# Patient Record
Sex: Male | Born: 1980 | Race: Black or African American | Hispanic: No | Marital: Married | Smoking: Current every day smoker
Health system: Southern US, Community
[De-identification: ages and names within clinical notes are randomized; demographics above are authoritative.]

## PROBLEM LIST (undated history)

## (undated) DIAGNOSIS — I10 Essential (primary) hypertension: Secondary | ICD-10-CM

## (undated) DIAGNOSIS — W540XXA Bitten by dog, initial encounter: Secondary | ICD-10-CM

## (undated) DIAGNOSIS — W3400XA Accidental discharge from unspecified firearms or gun, initial encounter: Secondary | ICD-10-CM

## (undated) DIAGNOSIS — W503XXA Accidental bite by another person, initial encounter: Secondary | ICD-10-CM

## (undated) DIAGNOSIS — A599 Trichomoniasis, unspecified: Secondary | ICD-10-CM

## (undated) DIAGNOSIS — Y249XXA Unspecified firearm discharge, undetermined intent, initial encounter: Secondary | ICD-10-CM

---

## 2005-03-26 ENCOUNTER — Emergency Department (HOSPITAL_COMMUNITY): Admission: EM | Admit: 2005-03-26 | Discharge: 2005-03-26 | Payer: Self-pay | Admitting: Emergency Medicine

## 2005-08-29 ENCOUNTER — Emergency Department (HOSPITAL_COMMUNITY): Admission: EM | Admit: 2005-08-29 | Discharge: 2005-08-29 | Payer: Self-pay | Admitting: Family Medicine

## 2005-11-04 ENCOUNTER — Emergency Department (HOSPITAL_COMMUNITY): Admission: EM | Admit: 2005-11-04 | Discharge: 2005-11-04 | Payer: Self-pay | Admitting: Emergency Medicine

## 2005-11-06 ENCOUNTER — Emergency Department (HOSPITAL_COMMUNITY): Admission: EM | Admit: 2005-11-06 | Discharge: 2005-11-06 | Payer: Self-pay | Admitting: Emergency Medicine

## 2005-11-13 ENCOUNTER — Emergency Department (HOSPITAL_COMMUNITY): Admission: EM | Admit: 2005-11-13 | Discharge: 2005-11-13 | Payer: Self-pay | Admitting: Emergency Medicine

## 2006-04-13 ENCOUNTER — Emergency Department (HOSPITAL_COMMUNITY): Admission: EM | Admit: 2006-04-13 | Discharge: 2006-04-13 | Payer: Self-pay | Admitting: Emergency Medicine

## 2006-04-15 ENCOUNTER — Emergency Department (HOSPITAL_COMMUNITY): Admission: EM | Admit: 2006-04-15 | Discharge: 2006-04-15 | Payer: Self-pay | Admitting: Emergency Medicine

## 2006-04-24 ENCOUNTER — Emergency Department (HOSPITAL_COMMUNITY): Admission: EM | Admit: 2006-04-24 | Discharge: 2006-04-24 | Payer: Self-pay | Admitting: Emergency Medicine

## 2006-04-24 ENCOUNTER — Emergency Department (HOSPITAL_COMMUNITY): Admission: EM | Admit: 2006-04-24 | Discharge: 2006-04-24 | Payer: Self-pay | Admitting: Family Medicine

## 2007-06-05 ENCOUNTER — Emergency Department (HOSPITAL_COMMUNITY): Admission: EM | Admit: 2007-06-05 | Discharge: 2007-06-05 | Payer: Self-pay | Admitting: Family Medicine

## 2007-06-06 ENCOUNTER — Emergency Department (HOSPITAL_COMMUNITY): Admission: EM | Admit: 2007-06-06 | Discharge: 2007-06-07 | Payer: Self-pay | Admitting: Emergency Medicine

## 2007-08-30 ENCOUNTER — Emergency Department (HOSPITAL_COMMUNITY): Admission: EM | Admit: 2007-08-30 | Discharge: 2007-08-30 | Payer: Self-pay | Admitting: Emergency Medicine

## 2007-08-31 ENCOUNTER — Emergency Department (HOSPITAL_COMMUNITY): Admission: EM | Admit: 2007-08-31 | Discharge: 2007-08-31 | Payer: Self-pay | Admitting: Emergency Medicine

## 2007-10-04 ENCOUNTER — Emergency Department (HOSPITAL_COMMUNITY): Admission: EM | Admit: 2007-10-04 | Discharge: 2007-10-04 | Payer: Self-pay | Admitting: Emergency Medicine

## 2009-03-11 ENCOUNTER — Emergency Department (HOSPITAL_COMMUNITY): Admission: EM | Admit: 2009-03-11 | Discharge: 2009-03-11 | Payer: Self-pay | Admitting: Family Medicine

## 2009-03-14 ENCOUNTER — Emergency Department (HOSPITAL_COMMUNITY): Admission: EM | Admit: 2009-03-14 | Discharge: 2009-03-14 | Payer: Self-pay | Admitting: Emergency Medicine

## 2009-08-16 ENCOUNTER — Emergency Department (HOSPITAL_COMMUNITY): Admission: EM | Admit: 2009-08-16 | Discharge: 2009-08-16 | Payer: Self-pay | Admitting: Emergency Medicine

## 2009-09-24 ENCOUNTER — Emergency Department (HOSPITAL_COMMUNITY): Admission: EM | Admit: 2009-09-24 | Discharge: 2009-09-24 | Payer: Self-pay | Admitting: Family Medicine

## 2010-08-30 LAB — GC/CHLAMYDIA PROBE AMP, GENITAL
Chlamydia, DNA Probe: NEGATIVE
GC Probe Amp, Genital: NEGATIVE

## 2011-02-14 LAB — POCT URINALYSIS DIP (DEVICE)
Bilirubin Urine: NEGATIVE
Glucose, UA: NEGATIVE
Ketones, ur: NEGATIVE
Nitrite: NEGATIVE
Operator id: 239701
Protein, ur: NEGATIVE
Specific Gravity, Urine: 1.025
Urobilinogen, UA: 1
pH: 6

## 2011-02-14 LAB — GC/CHLAMYDIA PROBE AMP, GENITAL
Chlamydia, DNA Probe: NEGATIVE
GC Probe Amp, Genital: NEGATIVE

## 2011-11-12 ENCOUNTER — Telehealth: Payer: Self-pay | Admitting: Pulmonary Disease

## 2011-11-12 NOTE — Telephone Encounter (Signed)
Documentation opened in error.

## 2012-02-03 ENCOUNTER — Encounter (HOSPITAL_COMMUNITY): Payer: Self-pay | Admitting: *Deleted

## 2012-02-03 ENCOUNTER — Emergency Department (INDEPENDENT_AMBULATORY_CARE_PROVIDER_SITE_OTHER)
Admission: EM | Admit: 2012-02-03 | Discharge: 2012-02-03 | Disposition: A | Discharge status: Home / Self Care | Payer: Self-pay | Source: Home / Self Care | Attending: Emergency Medicine | Admitting: Emergency Medicine

## 2012-02-03 DIAGNOSIS — N342 Other urethritis: Secondary | ICD-10-CM

## 2012-02-03 HISTORY — DX: Bitten by dog, initial encounter: W54.0XXA

## 2012-02-03 HISTORY — DX: Accidental bite by another person, initial encounter: W50.3XXA

## 2012-02-03 HISTORY — DX: Trichomoniasis, unspecified: A59.9

## 2012-02-03 HISTORY — DX: Unspecified firearm discharge, undetermined intent, initial encounter: Y24.9XXA

## 2012-02-03 HISTORY — DX: Accidental discharge from unspecified firearms or gun, initial encounter: W34.00XA

## 2012-02-03 LAB — WET PREP, GENITAL
Clue Cells Wet Prep HPF POC: NONE SEEN
Yeast Wet Prep HPF POC: NONE SEEN

## 2012-02-03 MED ORDER — LIDOCAINE HCL (PF) 1 % IJ SOLN
INTRAMUSCULAR | Status: AC
  Filled 2012-02-03: qty 5

## 2012-02-03 MED ORDER — AZITHROMYCIN 250 MG PO TABS
1000.0000 mg | ORAL_TABLET | Freq: Once | ORAL | Status: AC
  Administered 2012-02-03: 1000 mg via ORAL

## 2012-02-03 MED ORDER — AZITHROMYCIN 250 MG PO TABS
ORAL_TABLET | ORAL | Status: AC
  Filled 2012-02-03: qty 4

## 2012-02-03 MED ORDER — CEFTRIAXONE SODIUM 250 MG IJ SOLR
250.0000 mg | Freq: Once | INTRAMUSCULAR | Status: AC
  Administered 2012-02-03: 250 mg via INTRAMUSCULAR

## 2012-02-03 MED ORDER — CEFTRIAXONE SODIUM 250 MG IJ SOLR
INTRAMUSCULAR | Status: AC
  Filled 2012-02-03: qty 250

## 2012-02-03 MED ORDER — METRONIDAZOLE 500 MG PO TABS: 500.0000 mg | ORAL_TABLET | Freq: Two times a day (BID) | ORAL | Status: AC
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
## 2012-02-03 NOTE — ED Notes (Signed)
Pt reports yellow penile discharge and burning upon urination that started 3 days ago

## 2012-02-04 LAB — GC/CHLAMYDIA PROBE AMP, GENITAL
Chlamydia, DNA Probe: NEGATIVE
GC Probe Amp, Genital: NEGATIVE

## 2012-02-11 ENCOUNTER — Telehealth (HOSPITAL_COMMUNITY): Payer: Self-pay | Admitting: *Deleted

## 2012-02-11 NOTE — ED Notes (Signed)
Pt. called and asked for his lab results. I told him I would call back shortly. I called pt. back. Verifiedx2and given results. ( GC/Chlamydia neg., Wet prep: few WBC's). Pt. told that all was neg. Pt. is concerned because he had a yellowish d/c but does not know what caused it. I asked if it was gone?   He said it cleared up after the medication, but then passed a drop of blood from his penis x 1 after a BM.  I  Asked if he had any urinary symptoms like burning, frequency or urgency of urination.  He said no. I told pt. if it was just 1 time not to worry but if it returns, to come back and be rechecked.   Pt. voiced understanding. Vassie Moselle 02/11/2012

## 2013-08-17 ENCOUNTER — Other Ambulatory Visit (HOSPITAL_COMMUNITY)
Admission: RE | Admit: 2013-08-17 | Discharge: 2013-08-17 | Disposition: A | Discharge status: Home / Self Care | Payer: Self-pay | Source: Ambulatory Visit | Attending: Family Medicine | Admitting: Family Medicine

## 2013-08-17 ENCOUNTER — Encounter (HOSPITAL_COMMUNITY): Payer: Self-pay | Admitting: Emergency Medicine

## 2013-08-17 ENCOUNTER — Emergency Department (INDEPENDENT_AMBULATORY_CARE_PROVIDER_SITE_OTHER)
Admission: EM | Admit: 2013-08-17 | Discharge: 2013-08-17 | Disposition: A | Discharge status: Home / Self Care | Payer: Self-pay | Source: Home / Self Care | Attending: Family Medicine | Admitting: Family Medicine

## 2013-08-17 DIAGNOSIS — N342 Other urethritis: Secondary | ICD-10-CM

## 2013-08-17 DIAGNOSIS — Z113 Encounter for screening for infections with a predominantly sexual mode of transmission: Secondary | ICD-10-CM | POA: Insufficient documentation

## 2013-08-17 MED ORDER — CEFTRIAXONE SODIUM 1 G IJ SOLR: 1.0000 g | Freq: Once | INTRAMUSCULAR | Status: DC

## 2013-08-17 MED ORDER — CEFTRIAXONE SODIUM 250 MG IJ SOLR
INTRAMUSCULAR | Status: AC
  Filled 2013-08-17: qty 250

## 2013-08-17 MED ORDER — AZITHROMYCIN 250 MG PO TABS
ORAL_TABLET | ORAL | Status: AC
  Filled 2013-08-17: qty 4

## 2013-08-17 MED ORDER — CEFTRIAXONE SODIUM 250 MG IJ SOLR
250.0000 mg | Freq: Once | INTRAMUSCULAR | Status: AC
  Administered 2013-08-17: 250 mg via INTRAMUSCULAR

## 2013-08-17 MED ORDER — AZITHROMYCIN 250 MG PO TABS
1000.0000 mg | ORAL_TABLET | Freq: Once | ORAL | Status: AC
  Administered 2013-08-17: 1000 mg via ORAL

## 2013-08-17 MED ORDER — METRONIDAZOLE 500 MG PO TABS: 500.0000 mg | ORAL_TABLET | Freq: Two times a day (BID) | ORAL | Status: DC

## 2013-08-17 NOTE — ED Notes (Signed)
Pt     Reports  Symptoms    Of        Penile  Discharge         With  A  Slight  Burning    When     He  Urinates
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
## 2013-08-17 NOTE — Discharge Instructions (Signed)
We will call with positive test results and treat as indicated  °

## 2013-08-18 LAB — CYTOLOGY, (ORAL, ANAL, URETHRAL) ANCILLARY ONLY
Chlamydia: NEGATIVE
Neisseria Gonorrhea: NEGATIVE

## 2013-08-18 LAB — URINE CYTOLOGY ANCILLARY ONLY: TRICH (WINDOWPATH): POSITIVE — AB

## 2013-08-19 NOTE — ED Notes (Signed)
GC/Chlamydia neg., Trich pos. Pt. adequately treated with Flagyl. Needs notified. Vassie MoselleYork, Nelissa Bolduc M 08/19/2013

## 2013-08-20 ENCOUNTER — Telehealth (HOSPITAL_COMMUNITY): Payer: Self-pay | Admitting: *Deleted

## 2013-08-20 NOTE — ED Notes (Addendum)
GC/Chlamydia neg., Trich pos. Pt. adequately treated with Flagyl. I called pt. and left a message to call.  Call 1. Alexander Stone, Alexander Stone M 08/20/2013 The person that answered the phone said it is wrong number. I verified that I dialed correctly. I called home number and left a message to call.  Call 3. 08/23/2013 4/6 Unable to reach pt. by phone. Letter sent. 09/15/2013

## 2013-12-15 ENCOUNTER — Other Ambulatory Visit (HOSPITAL_COMMUNITY)
Admission: RE | Admit: 2013-12-15 | Discharge: 2013-12-15 | Disposition: A | Discharge status: Home / Self Care | Payer: Self-pay | Source: Ambulatory Visit | Attending: Family Medicine | Admitting: Family Medicine

## 2013-12-15 ENCOUNTER — Emergency Department (HOSPITAL_COMMUNITY)
Admission: EM | Admit: 2013-12-15 | Discharge: 2013-12-15 | Disposition: A | Discharge status: Home / Self Care | Payer: Self-pay | Source: Home / Self Care | Attending: Family Medicine | Admitting: Family Medicine

## 2013-12-15 ENCOUNTER — Encounter (HOSPITAL_COMMUNITY): Payer: Self-pay | Admitting: Emergency Medicine

## 2013-12-15 DIAGNOSIS — Z202 Contact with and (suspected) exposure to infections with a predominantly sexual mode of transmission: Secondary | ICD-10-CM

## 2013-12-15 DIAGNOSIS — Z113 Encounter for screening for infections with a predominantly sexual mode of transmission: Secondary | ICD-10-CM | POA: Insufficient documentation

## 2013-12-15 DIAGNOSIS — N342 Other urethritis: Secondary | ICD-10-CM

## 2013-12-15 MED ORDER — METRONIDAZOLE 500 MG PO TABS: 1000.0000 mg | ORAL_TABLET | Freq: Two times a day (BID) | ORAL | Status: DC

## 2013-12-15 NOTE — Discharge Instructions (Signed)
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
## 2013-12-15 NOTE — ED Notes (Signed)
Call patient at : [phone] for lab issues

## 2013-12-15 NOTE — ED Notes (Signed)
Previous partener informed him >1 month ago that she had trichomoniasis , but he had no symptoms.  Now has developed "internal irritation of his penis" denies visible changes in skin

## 2013-12-17 NOTE — ED Provider Notes (Signed)
Medical screening examination/treatment/procedure(s) were performed by a resident physician or non-physician practitioner and as the supervising physician I was immediately available for consultation/collaboration.  Clementeen GrahamEvan Kazuko Clemence, MD    Rodolph BongEvan S Guled Gahan, MD 12/17/13 [phone]

## 2014-01-26 ENCOUNTER — Encounter (HOSPITAL_COMMUNITY): Payer: Self-pay | Admitting: Emergency Medicine

## 2014-01-26 ENCOUNTER — Emergency Department (INDEPENDENT_AMBULATORY_CARE_PROVIDER_SITE_OTHER)
Admission: EM | Admit: 2014-01-26 | Discharge: 2014-01-26 | Disposition: A | Discharge status: Home / Self Care | Payer: Self-pay | Source: Home / Self Care | Attending: Family Medicine | Admitting: Family Medicine

## 2014-01-26 DIAGNOSIS — A599 Trichomoniasis, unspecified: Secondary | ICD-10-CM

## 2014-01-26 MED ORDER — METRONIDAZOLE 500 MG PO TABS: 1000.0000 mg | ORAL_TABLET | Freq: Two times a day (BID) | ORAL | Status: DC

## 2014-01-26 NOTE — ED Notes (Signed)
States he was here in July because his girlfriend had Trich.  He was given Metronidazole.  D/C stopped and started back 1 week ago.  Had sexual contact on Tues with a condom.
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
## 2014-01-26 NOTE — Discharge Instructions (Signed)
You likely still have a trichomonal infection Please take the metro as prescribed for the full 7 days Please avoid alcohol during this time   Trichomoniasis Trichomoniasis is an infection caused by an organism called Trichomonas. The infection can affect both women and men. In women, the outer male genitalia and the vagina are affected. In men, the penis is mainly affected, but the prostate and other reproductive organs can also be involved. Trichomoniasis is a sexually transmitted infection (STI) and is most often passed to another person through sexual contact.  RISK FACTORS  Having unprotected sexual intercourse.  Having sexual intercourse with an infected partner. SIGNS AND SYMPTOMS  Symptoms of trichomoniasis in women include:  Abnormal gray-green frothy vaginal discharge.  Itching and irritation of the vagina.  Itching and irritation of the area outside the vagina. Symptoms of trichomoniasis in men include:   Penile discharge with or without pain.  Pain during urination. This results from inflammation of the urethra. DIAGNOSIS  Trichomoniasis may be found during a Pap test or physical exam. Your health care provider may use one of the following methods to help diagnose this infection:  Examining vaginal discharge under a microscope. For men, urethral discharge would be examined.  Testing the pH of the vagina with a test tape.  Using a vaginal swab test that checks for the Trichomonas organism. A test is available that provides results within a few minutes.  Doing a culture test for the organism. This is not usually needed. TREATMENT   You may be given medicine to fight the infection. Women should inform their health care provider if they could be or are pregnant. Some medicines used to treat the infection should not be taken during pregnancy.  Your health care provider may recommend over-the-counter medicines or creams to decrease itching or irritation.  Your sexual  partner will need to be treated if infected. HOME CARE INSTRUCTIONS   Take medicines only as directed by your health care provider.  Take over-the-counter medicine for itching or irritation as directed by your health care provider.  Do not have sexual intercourse while you have the infection.  Women should not douche or wear tampons while they have the infection.  Discuss your infection with your partner. Your partner may have gotten the infection from you, or you may have gotten it from your partner.  Have your sex partner get examined and treated if necessary.  Practice safe, informed, and protected sex.  See your health care provider for other STI testing. SEEK MEDICAL CARE IF:   You still have symptoms after you finish your medicine.  You develop abdominal pain.  You have pain when you urinate.  You have bleeding after sexual intercourse.  You develop a rash.  Your medicine makes you sick or makes you throw up (vomit). MAKE SURE YOU:  Understand these instructions.  Will watch your condition.  Will get help right away if you are not doing well or get worse. Document Released: 11/06/2000 Document Revised: 09/27/2013 Document Reviewed: 02/22/2013 [facility] Patient Information 2015 [place], [place]. This information is not intended to replace advice given to you by your health care provider. Make sure you discuss any questions you have with your health care provider.

## 2014-09-09 ENCOUNTER — Emergency Department (HOSPITAL_COMMUNITY): Payer: Self-pay

## 2014-09-09 ENCOUNTER — Encounter (HOSPITAL_COMMUNITY): Payer: Self-pay | Admitting: Emergency Medicine

## 2014-09-09 ENCOUNTER — Emergency Department (HOSPITAL_COMMUNITY)
Admission: EM | Admit: 2014-09-09 | Discharge: 2014-09-09 | Disposition: A | Discharge status: Home / Self Care | Payer: Self-pay | Attending: Emergency Medicine | Admitting: Emergency Medicine

## 2014-09-09 DIAGNOSIS — Z8619 Personal history of other infectious and parasitic diseases: Secondary | ICD-10-CM | POA: Insufficient documentation

## 2014-09-09 DIAGNOSIS — Y92410 Unspecified street and highway as the place of occurrence of the external cause: Secondary | ICD-10-CM | POA: Insufficient documentation

## 2014-09-09 DIAGNOSIS — Z880 Allergy status to penicillin: Secondary | ICD-10-CM | POA: Insufficient documentation

## 2014-09-09 DIAGNOSIS — S3992XA Unspecified injury of lower back, initial encounter: Secondary | ICD-10-CM | POA: Insufficient documentation

## 2014-09-09 DIAGNOSIS — Z720 Tobacco use: Secondary | ICD-10-CM | POA: Insufficient documentation

## 2014-09-09 DIAGNOSIS — Y998 Other external cause status: Secondary | ICD-10-CM | POA: Insufficient documentation

## 2014-09-09 DIAGNOSIS — Z792 Long term (current) use of antibiotics: Secondary | ICD-10-CM | POA: Insufficient documentation

## 2014-09-09 DIAGNOSIS — Y9389 Activity, other specified: Secondary | ICD-10-CM | POA: Insufficient documentation

## 2014-09-09 DIAGNOSIS — S0990XA Unspecified injury of head, initial encounter: Secondary | ICD-10-CM | POA: Insufficient documentation

## 2014-09-09 DIAGNOSIS — S20219A Contusion of unspecified front wall of thorax, initial encounter: Secondary | ICD-10-CM | POA: Insufficient documentation

## 2014-09-09 DIAGNOSIS — S161XXA Strain of muscle, fascia and tendon at neck level, initial encounter: Secondary | ICD-10-CM | POA: Insufficient documentation

## 2014-09-09 MED ORDER — IBUPROFEN 200 MG PO TABS
600.0000 mg | ORAL_TABLET | Freq: Once | ORAL | Status: AC
  Administered 2014-09-09: 600 mg via ORAL
  Filled 2014-09-09: qty 3

## 2014-09-09 MED ORDER — CYCLOBENZAPRINE HCL 5 MG PO TABS: 5.0000 mg | ORAL_TABLET | Freq: Three times a day (TID) | ORAL | Status: DC | PRN

## 2014-09-09 MED ORDER — IBUPROFEN 600 MG PO TABS: 600.0000 mg | ORAL_TABLET | Freq: Three times a day (TID) | ORAL | Status: DC
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
## 2014-10-10 ENCOUNTER — Encounter (HOSPITAL_COMMUNITY): Payer: Self-pay

## 2014-10-10 ENCOUNTER — Other Ambulatory Visit (HOSPITAL_COMMUNITY)
Admission: RE | Admit: 2014-10-10 | Discharge: 2014-10-10 | Disposition: A | Discharge status: Home / Self Care | Payer: Self-pay | Source: Ambulatory Visit | Attending: Family Medicine | Admitting: Family Medicine

## 2014-10-10 ENCOUNTER — Emergency Department (INDEPENDENT_AMBULATORY_CARE_PROVIDER_SITE_OTHER)
Admission: EM | Admit: 2014-10-10 | Discharge: 2014-10-10 | Disposition: A | Discharge status: Home / Self Care | Payer: Self-pay | Source: Home / Self Care | Attending: Family Medicine | Admitting: Family Medicine

## 2014-10-10 DIAGNOSIS — Z113 Encounter for screening for infections with a predominantly sexual mode of transmission: Secondary | ICD-10-CM | POA: Insufficient documentation

## 2014-10-10 DIAGNOSIS — N342 Other urethritis: Secondary | ICD-10-CM

## 2014-10-10 MED ORDER — LIDOCAINE HCL (PF) 1 % IJ SOLN
INTRAMUSCULAR | Status: AC
  Filled 2014-10-10: qty 5

## 2014-10-10 MED ORDER — AZITHROMYCIN 250 MG PO TABS
1000.0000 mg | ORAL_TABLET | Freq: Once | ORAL | Status: AC
  Administered 2014-10-10: 1000 mg via ORAL

## 2014-10-10 MED ORDER — CEFTRIAXONE SODIUM 250 MG IJ SOLR
250.0000 mg | Freq: Once | INTRAMUSCULAR | Status: AC
  Administered 2014-10-10: 250 mg via INTRAMUSCULAR

## 2014-10-10 MED ORDER — AZITHROMYCIN 250 MG PO TABS
ORAL_TABLET | ORAL | Status: AC
  Filled 2014-10-10: qty 4

## 2014-10-10 MED ORDER — CEFTRIAXONE SODIUM 250 MG IJ SOLR
INTRAMUSCULAR | Status: AC
  Filled 2014-10-10: qty 250
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
## 2014-10-10 NOTE — ED Notes (Signed)
Was reportedly told by a partner to be checked

## 2014-10-10 NOTE — Discharge Instructions (Signed)
Urethritis °Urethritis is an inflammation of the tube through which urine exits your bladder (urethra).  °CAUSES °Urethritis is often caused by an infection in your urethra. The infection can be viral, like herpes. The infection can also be bacterial, like gonorrhea. °RISK FACTORS °Risk factors of urethritis include: °· Having sex without using a condom. °· Having multiple sexual partners. °· Having poor hygiene. °SIGNS AND SYMPTOMS °Symptoms of urethritis are less noticeable in women than in men. These symptoms include: °· Burning feeling when you urinate (dysuria). °· Discharge from your urethra. °· Blood in your urine (hematuria). °· Urinating more than usual. °DIAGNOSIS  °To confirm a diagnosis of urethritis, your health care provider will do the following: °· Ask about your sexual history. °· Perform a physical exam. °· Have you provide a sample of your urine for lab testing. °· Use a cotton swab to gently collect a sample from your urethra for lab testing. °TREATMENT  °It is important to treat urethritis. Depending on the cause, untreated urethritis may lead to serious genital infections and possibly infertility. Urethritis caused by a bacterial infection is treated with antibiotic medicine. All sexual partners must be treated.  °HOME CARE INSTRUCTIONS °· Do not have sex until the test results are known and treatment is completed, even if your symptoms go away before you finish treatment. °· If you were prescribed an antibiotic, finish it all even if you start to feel better. °SEEK MEDICAL CARE IF:  °· Your symptoms are not improved in 3 days. °· Your symptoms are getting worse. °· You develop abdominal pain or pelvic pain (in women). °· You develop joint pain. °· You have a fever. °SEEK IMMEDIATE MEDICAL CARE IF:  °· You have severe pain in the belly, back, or side. °· You have repeated vomiting. °MAKE SURE YOU: °· Understand these instructions. °· Will watch your condition. °· Will get help right away if you  are not doing well or get worse. °Document Released: 11/06/2000 Document Revised: 09/27/2013 Document Reviewed: 01/11/2013 °ExitCare® Patient Information ©2015 [employer]. This information is not intended to replace advice given to you by your health care provider. Make sure you discuss any questions you have with your health care provider. ° °

## 2014-10-11 LAB — RPR: RPR: NONREACTIVE

## 2014-10-11 LAB — URINE CYTOLOGY ANCILLARY ONLY
CHLAMYDIA, DNA PROBE: POSITIVE — AB
NEISSERIA GONORRHEA: NEGATIVE
TRICH (WINDOWPATH): NEGATIVE

## 2014-10-11 LAB — HIV ANTIBODY (ROUTINE TESTING W REFLEX): HIV Screen 4th Generation wRfx: NONREACTIVE

## 2014-10-12 ENCOUNTER — Telehealth (HOSPITAL_COMMUNITY): Payer: Self-pay | Admitting: *Deleted

## 2014-10-12 NOTE — ED Notes (Signed)
GC/Trich neg., Chlamydia pos., HIV/RPR non-reactive.  I called pt.  Pt. verified x 2.  Pt. said someone already notified him today. He asked if he needed any other treatment. I told him he was adequately treated with Zithromax.  Pt. instructed to notify his partner, no sex for 1 week and to practice safe sex. Pt. told he [facility], by appointment. Pt. voiced understanding. DHHS [facility]. Vassie MoselleYork, Alexander Stone 10/12/2014

## 2014-11-07 ENCOUNTER — Encounter: Payer: Self-pay | Admitting: *Deleted

## 2014-11-07 ENCOUNTER — Emergency Department (INDEPENDENT_AMBULATORY_CARE_PROVIDER_SITE_OTHER)
Admission: EM | Admit: 2014-11-07 | Discharge: 2014-11-07 | Disposition: A | Discharge status: Home / Self Care | Payer: Self-pay | Source: Home / Self Care | Attending: Family Medicine | Admitting: Family Medicine

## 2014-11-07 ENCOUNTER — Encounter (HOSPITAL_COMMUNITY): Payer: Self-pay | Admitting: Emergency Medicine

## 2014-11-07 ENCOUNTER — Other Ambulatory Visit (HOSPITAL_COMMUNITY)
Admission: RE | Admit: 2014-11-07 | Discharge: 2014-11-07 | Disposition: A | Discharge status: Home / Self Care | Payer: Self-pay | Source: Ambulatory Visit | Attending: Family Medicine | Admitting: Family Medicine

## 2014-11-07 DIAGNOSIS — A749 Chlamydial infection, unspecified: Secondary | ICD-10-CM

## 2014-11-07 DIAGNOSIS — Z113 Encounter for screening for infections with a predominantly sexual mode of transmission: Secondary | ICD-10-CM | POA: Insufficient documentation

## 2014-11-07 MED ORDER — AZITHROMYCIN 250 MG PO TABS
ORAL_TABLET | ORAL | Status: AC
  Filled 2014-11-07: qty 4

## 2014-11-07 MED ORDER — AZITHROMYCIN 250 MG PO TABS
1000.0000 mg | ORAL_TABLET | Freq: Once | ORAL | Status: AC
  Administered 2014-11-07: 1000 mg via ORAL

## 2014-11-07 NOTE — ED Notes (Signed)
Call back number verified.  

## 2014-11-07 NOTE — ED Notes (Signed)
Pt is here to get treated for Chlamydia Seen here on 6/13 Alert, no signs of acute distress.

## 2014-11-07 NOTE — Discharge Instructions (Signed)
Thank you for coming in today.   Chlamydia Chlamydia is an infection. It is spread through sexual contact. Chlamydia can be in different areas of the body. These areas include the urethra, throat, or rectum. It is important to treat chlamydia as soon as possible. It can damage other organs.  CAUSES  Chlamydia is caused by bacteria. It is a sexually transmitted disease. This means that it is passed from an infected partner during intimate contact. This contact could be with the genitals, mouth, or rectal area.  SIGNS AND SYMPTOMS  There may not be any symptoms. This is often the case early in the infection. If there are symptoms, they are usually mild and may only be noticeable in the morning. Symptoms you may notice include:   Burning with urination.  Pain or swelling in the testicles.  Watery mucus-like discharge from the penis.  Long-standing (chronic) pelvic pain after frequent infections.  Pain, swelling, or itching around the anus.  A sore throat.  Itching, burning, or redness in the eyes, or discharge from the eyes. DIAGNOSIS  To diagnose this infection, your health care provider will do a pelvic exam. A sample of urine or a swab from the rectum may be taken for testing.  TREATMENT  Chlamydia is treated with antibiotic medicines.  HOME CARE INSTRUCTIONS  Take your antibiotic medicine as directed by your health care provider. Finish the antibiotic even if you start to feel better. Incomplete treatment will put you at risk for not being able to have children (sterility).   Take medicines only as directed by your health care provider.   Rest.   Inform any sexual partners about your infection. Even if they are symptom free or have a negative culture or evaluation, they should be treated for the condition.   Do not have sex (intercourse) until treatment is completed and your health care provider says it is okay.   Keep all follow-up visits as directed by your health care  provider.   Not all test results are available during your visit. If your test results are not back during the visit, make an appointment with your health care provider to find out the results. Do not assume everything is normal if you have not heard from your health care provider or the medical facility. It is your responsibility to get your test results. SEEK MEDICAL CARE IF:  You develop new joint pain.  You have a fever. SEEK IMMEDIATE MEDICAL CARE IF:   Your pain increases.   You have abnormal discharge.   You have pain during intercourse. MAKE SURE YOU:   Understand these instructions.  Will watch your condition.  Will get help right away if you are not doing well or get worse. Document Released: 05/13/2005 Document Revised: 09/27/2013 Document Reviewed: 11/19/2012 [facility] Patient Information 2015 [place], [place]. This information is not intended to replace advice given to you by your health care provider. Make sure you discuss any questions you have with your health care provider.
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
## 2014-11-08 LAB — URINE CYTOLOGY ANCILLARY ONLY
CHLAMYDIA, DNA PROBE: NEGATIVE
NEISSERIA GONORRHEA: NEGATIVE
Trichomonas: NEGATIVE

## 2014-11-08 NOTE — ED Notes (Signed)
Final reports STD screenings negative

## 2015-02-20 ENCOUNTER — Emergency Department (INDEPENDENT_AMBULATORY_CARE_PROVIDER_SITE_OTHER)
Admission: EM | Admit: 2015-02-20 | Discharge: 2015-02-20 | Disposition: A | Discharge status: Home / Self Care | Payer: Self-pay | Source: Home / Self Care | Attending: Family Medicine | Admitting: Family Medicine

## 2015-02-20 ENCOUNTER — Encounter (HOSPITAL_COMMUNITY): Payer: Self-pay | Admitting: Emergency Medicine

## 2015-02-20 ENCOUNTER — Other Ambulatory Visit (HOSPITAL_COMMUNITY)
Admission: RE | Admit: 2015-02-20 | Discharge: 2015-02-20 | Disposition: A | Discharge status: Home / Self Care | Payer: Self-pay | Source: Ambulatory Visit | Attending: Family Medicine | Admitting: Family Medicine

## 2015-02-20 DIAGNOSIS — Z202 Contact with and (suspected) exposure to infections with a predominantly sexual mode of transmission: Secondary | ICD-10-CM

## 2015-02-20 DIAGNOSIS — Z113 Encounter for screening for infections with a predominantly sexual mode of transmission: Secondary | ICD-10-CM | POA: Insufficient documentation

## 2015-02-20 DIAGNOSIS — R369 Urethral discharge, unspecified: Secondary | ICD-10-CM

## 2015-02-20 DIAGNOSIS — Z7251 High risk heterosexual behavior: Secondary | ICD-10-CM

## 2015-02-20 LAB — POCT URINALYSIS DIP (DEVICE)
Bilirubin Urine: NEGATIVE
GLUCOSE, UA: NEGATIVE mg/dL
Hgb urine dipstick: NEGATIVE
Ketones, ur: NEGATIVE mg/dL
LEUKOCYTES UA: NEGATIVE
NITRITE: NEGATIVE
Protein, ur: NEGATIVE mg/dL
UROBILINOGEN UA: 0.2 mg/dL (ref 0.0–1.0)
pH: 6 (ref 5.0–8.0)

## 2015-02-20 MED ORDER — AZITHROMYCIN 250 MG PO TABS
1000.0000 mg | ORAL_TABLET | Freq: Once | ORAL | Status: AC
  Administered 2015-02-20: 1000 mg via ORAL

## 2015-02-20 MED ORDER — CEFTRIAXONE SODIUM 250 MG IJ SOLR
INTRAMUSCULAR | Status: AC
  Filled 2015-02-20: qty 250

## 2015-02-20 MED ORDER — METRONIDAZOLE 500 MG PO TABS: 500.0000 mg | ORAL_TABLET | Freq: Two times a day (BID) | ORAL | Status: DC

## 2015-02-20 MED ORDER — LIDOCAINE HCL (PF) 1 % IJ SOLN
INTRAMUSCULAR | Status: AC
  Filled 2015-02-20: qty 5

## 2015-02-20 MED ORDER — CEFTRIAXONE SODIUM 250 MG IJ SOLR
250.0000 mg | Freq: Once | INTRAMUSCULAR | Status: AC
  Administered 2015-02-20: 250 mg via INTRAMUSCULAR

## 2015-02-20 MED ORDER — AZITHROMYCIN 250 MG PO TABS
ORAL_TABLET | ORAL | Status: AC
  Filled 2015-02-20: qty 4

## 2015-02-20 NOTE — ED Notes (Signed)
The patient reported to the [facility] with a complaint of an exposure to an STD. The patient stated that he has had a discharge as well as dysuria.
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
## 2015-02-20 NOTE — Discharge Instructions (Signed)
Sexually Transmitted Disease °A sexually transmitted disease (STD) is a disease or infection that may be passed (transmitted) from person to person, usually during sexual activity. This may happen by way of saliva, semen, blood, vaginal mucus, or urine. Common STDs include:  °· Gonorrhea.   °· Chlamydia.   °· Syphilis.   °· HIV and AIDS.   °· Genital herpes.   °· Hepatitis B and C.   °· Trichomonas.   °· Human papillomavirus (HPV).   °· Pubic lice.   °· Scabies. °· Mites. °· Bacterial vaginosis. °WHAT ARE CAUSES OF STDs? °An STD may be caused by bacteria, a virus, or parasites. STDs are often transmitted during sexual activity if one person is infected. However, they may also be transmitted through nonsexual means. STDs may be transmitted after:  °· Sexual intercourse with an infected person.   °· Sharing sex toys with an infected person.   °· Sharing needles with an infected person or using unclean piercing or tattoo needles. °· Having intimate contact with the genitals, mouth, or rectal areas of an infected person.   °· Exposure to infected fluids during birth. °WHAT ARE THE SIGNS AND SYMPTOMS OF STDs? °Different STDs have different symptoms. Some people may not have any symptoms. If symptoms are present, they may include:  °· Painful or bloody urination.   °· Pain in the pelvis, abdomen, vagina, anus, throat, or eyes.   °· A skin rash, itching, or irritation. °· Growths, ulcerations, blisters, or sores in the genital and anal areas. °· Abnormal vaginal discharge with or without bad odor.   °· Penile discharge in men.   °· Fever.   °· Pain or bleeding during sexual intercourse.   °· Swollen glands in the groin area.   °· Yellow skin and eyes (jaundice). This is seen with hepatitis.   °· Swollen testicles. °· Infertility. °· Sores and blisters in the mouth. °HOW ARE STDs DIAGNOSED? °To make a diagnosis, your health care provider may:  °· Take a medical history.   °· Perform a physical exam.   °· Take a sample of  any discharge to examine. °· Swab the throat, cervix, opening to the penis, rectum, or vagina for testing. °· Test a sample of your first morning urine.   °· Perform blood tests.   °· Perform a Pap test, if this applies.   °· Perform a colposcopy.   °· Perform a laparoscopy.   °HOW ARE STDs TREATED? ° Treatment depends on the STD. Some STDs may be treated but not cured.  °· Chlamydia, gonorrhea, trichomonas, and syphilis can be cured with antibiotic medicine.   °· Genital herpes, hepatitis, and HIV can be treated, but not cured, with prescribed medicines. The medicines lessen symptoms.   °· Genital warts from HPV can be treated with medicine or by freezing, burning (electrocautery), or surgery. Warts may come back.   °· HPV cannot be cured with medicine or surgery. However, abnormal areas may be removed from the cervix, vagina, or vulva.   °· If your diagnosis is confirmed, your recent sexual partners need treatment. This is true even if they are symptom-free or have a negative culture or evaluation. They should not have sex until their health care providers say it is okay. °HOW CAN I REDUCE MY RISK OF GETTING AN STD? °Take these steps to reduce your risk of getting an STD: °· Use latex condoms, dental dams, and water-soluble lubricants during sexual activity. Do not use petroleum jelly or oils. °· Avoid having multiple sex partners. °· Do not have sex with someone who has other sex partners. °· Do not have sex with anyone you do not know or who is at   high risk for an STD. °· Avoid risky sex practices that can break your skin. °· Do not have sex if you have open sores on your mouth or skin. °· Avoid drinking too much alcohol or taking illegal drugs. Alcohol and drugs can affect your judgment and put you in a vulnerable position. °· Avoid engaging in oral and anal sex acts. °· Get vaccinated for HPV and hepatitis. If you have not received these vaccines in the past, talk to your health care provider about whether one  or both might be right for you.   °· If you are at risk of being infected with HIV, it is recommended that you take a prescription medicine daily to prevent HIV infection. This is called pre-exposure prophylaxis (PrEP). You are considered at risk if: °¨ You are a man who has sex with other men (MSM). °¨ You are a heterosexual man or woman and are sexually active with more than one partner. °¨ You take drugs by injection. °¨ You are sexually active with a partner who has HIV. °· Talk with your health care provider about whether you are at high risk of being infected with HIV. If you choose to begin PrEP, you should first be tested for HIV. You should then be tested every 3 months for as long as you are taking PrEP.   °WHAT SHOULD I DO IF I THINK I HAVE AN STD? °· See your health care provider.   °· Tell your sexual partner(s). They should be tested and treated for any STDs. °· Do not have sex until your health care provider says it is okay.  °WHEN SHOULD I GET IMMEDIATE MEDICAL CARE? °Contact your health care provider right away if:  °· You have severe abdominal pain. °· You are a man and notice swelling or pain in your testicles. °· You are a woman and notice swelling or pain in your vagina. °Document Released: 08/03/2002 Document Revised: 05/18/2013 Document Reviewed: 12/01/2012 °ExitCare® Patient Information ©2015 [employer]. This information is not intended to replace advice given to you by your health care provider. Make sure you discuss any questions you have with your health care provider. ° °Safe Sex °Safe sex is about reducing the risk of giving or getting a sexually transmitted disease (STD). STDs are spread through sexual contact involving the genitals, mouth, or rectum. Some STDs can be cured and others cannot. Safe sex can also prevent unintended pregnancies.  °WHAT ARE SOME SAFE SEX PRACTICES? °· Limit your sexual activity to only one partner who is having sex with only you. °· Talk to your partner  about his or her past partners, past STDs, and drug use. °· Use a condom every time you have sexual intercourse. This includes vaginal, oral, and anal sexual activity. Both females and males should wear condoms during oral sex. Only use latex or polyurethane condoms and water-based lubricants. Using petroleum-based lubricants or oils to lubricate a condom will weaken the condom and increase the chance that it will break. The condom should be in place from the beginning to the end of sexual activity. Wearing a condom reduces, but does not completely eliminate, your risk of getting or giving an STD. STDs can be spread by contact with infected body fluids and skin. °· Get vaccinated for hepatitis B and HPV. °· Avoid alcohol and recreational drugs, which can affect your judgment. You may forget to use a condom or participate in high-risk sex. °· For females, avoid douching after sexual intercourse. Douching can spread an infection   farther into the reproductive tract. °· Check your body for signs of sores, blisters, rashes, or unusual discharge. See your health care provider if you notice any of these signs. °· Avoid sexual contact if you have symptoms of an infection or are being treated for an STD. If you or your partner has herpes, avoid sexual contact when blisters are present. Use condoms at all other times. °· If you are at risk of being infected with HIV, it is recommended that you take a prescription medicine daily to prevent HIV infection. This is called pre-exposure prophylaxis (PrEP). You are considered at risk if: °¨ You are a man who has sex with other men (MSM). °¨ You are a heterosexual man or woman who is sexually active with more than one partner. °¨ You take drugs by injection. °¨ You are sexually active with a partner who has HIV. °· Talk with your health care provider about whether you are at high risk of being infected with HIV. If you choose to begin PrEP, you should first be tested for HIV. You  should then be tested every 3 months for as long as you are taking PrEP. °· See your health care provider for regular screenings, exams, and tests for other STDs. Before having sex with a new partner, each of you should be screened for STDs and should talk about the results with each other. °WHAT ARE THE BENEFITS OF SAFE SEX?  °· There is less chance of getting or giving an STD. °· You can prevent unwanted or unintended pregnancies. °· By discussing safe sex concerns with your partner, you may increase feelings of intimacy, comfort, trust, and honesty between the two of you. °Document Released: 06/20/2004 Document Revised: 09/27/2013 Document Reviewed: 11/04/2011 °ExitCare® Patient Information ©2015 [employer]. This information is not intended to replace advice given to you by your health care provider. Make sure you discuss any questions you have with your health care provider. ° °

## 2015-02-21 LAB — URINE CYTOLOGY ANCILLARY ONLY
CHLAMYDIA, DNA PROBE: NEGATIVE
NEISSERIA GONORRHEA: NEGATIVE
TRICH (WINDOWPATH): NEGATIVE

## 2015-02-21 NOTE — ED Notes (Signed)
Attempted to contact patient to advise of negative reports on STD labs. Patient not set up to receive messages at this time, will attempt to reach at a later time

## 2015-02-21 NOTE — ED Notes (Signed)
Attempted to call patient, but voice message indicates :"The person you have called is unavailable right now; please try your call again later"

## 2015-07-03 ENCOUNTER — Emergency Department (INDEPENDENT_AMBULATORY_CARE_PROVIDER_SITE_OTHER)
Admission: EM | Admit: 2015-07-03 | Discharge: 2015-07-03 | Disposition: A | Discharge status: Home / Self Care | Payer: Self-pay | Source: Home / Self Care | Attending: Family Medicine | Admitting: Family Medicine

## 2015-07-03 ENCOUNTER — Encounter (HOSPITAL_COMMUNITY): Payer: Self-pay | Admitting: *Deleted

## 2015-07-03 ENCOUNTER — Other Ambulatory Visit (HOSPITAL_COMMUNITY)
Admission: RE | Admit: 2015-07-03 | Discharge: 2015-07-03 | Disposition: A | Discharge status: Home / Self Care | Payer: Self-pay | Source: Ambulatory Visit | Attending: Family Medicine | Admitting: Family Medicine

## 2015-07-03 DIAGNOSIS — R369 Urethral discharge, unspecified: Secondary | ICD-10-CM

## 2015-07-03 DIAGNOSIS — Z113 Encounter for screening for infections with a predominantly sexual mode of transmission: Secondary | ICD-10-CM | POA: Insufficient documentation

## 2015-07-03 LAB — POCT URINALYSIS DIP (DEVICE)
BILIRUBIN URINE: NEGATIVE
Glucose, UA: NEGATIVE mg/dL
KETONES UR: NEGATIVE mg/dL
Leukocytes, UA: NEGATIVE
Nitrite: NEGATIVE
Protein, ur: NEGATIVE mg/dL
Specific Gravity, Urine: 1.025 (ref 1.005–1.030)
Urobilinogen, UA: 0.2 mg/dL (ref 0.0–1.0)
pH: 6.5 (ref 5.0–8.0)

## 2015-07-03 MED ORDER — AZITHROMYCIN 250 MG PO TABS
1000.0000 mg | ORAL_TABLET | Freq: Once | ORAL | Status: AC
  Administered 2015-07-03: 1000 mg via ORAL

## 2015-07-03 MED ORDER — CEFTRIAXONE SODIUM 250 MG IJ SOLR
INTRAMUSCULAR | Status: AC
  Filled 2015-07-03: qty 250

## 2015-07-03 MED ORDER — AZITHROMYCIN 250 MG PO TABS
ORAL_TABLET | ORAL | Status: AC
  Filled 2015-07-03: qty 4

## 2015-07-03 MED ORDER — CEFTRIAXONE SODIUM 250 MG IJ SOLR
250.0000 mg | Freq: Once | INTRAMUSCULAR | Status: AC
  Administered 2015-07-03: 250 mg via INTRAMUSCULAR

## 2015-07-03 NOTE — Discharge Instructions (Signed)
You were tested for sexually transmitted infections that could cause your symptoms. We will call you with positive results. You can also contact the IT department to make sure you can access your MyChart. You were treated in case you had gonorrhea or chlamydia, or non-gonococcal urethritis (NGU). You should have all partners tested as well.   Return to the urgent care if you experience an allergic reaction, fever, testicular pain or swelling, or other changes in symptoms.

## 2015-07-03 NOTE — ED Notes (Signed)
Pt   Reports      Symptoms       Of  Penile   Discharge        And  Irritation         Symptoms        For  About  1  Month      [facility]  [facility]        [facility]  [facility]         Was  Not  Treated  For  Anything
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
## 2015-07-04 LAB — URINE CYTOLOGY ANCILLARY ONLY
CHLAMYDIA, DNA PROBE: NEGATIVE
Neisseria Gonorrhea: NEGATIVE
Trichomonas: NEGATIVE

## 2015-07-17 ENCOUNTER — Telehealth (HOSPITAL_COMMUNITY): Payer: Self-pay | Admitting: Emergency Medicine

## 2015-07-17 NOTE — ED Notes (Signed)
x1 attempt LM on pt's VM at [phone]... Also called [phone] but VM was full Need to give lab results from recent visit on 2/6  Per Dr. Dayton Scrape,  Please let patient know that chlamydia/gonorrhea/trichomonas tests were negative. LM  Will try later.

## 2015-07-24 NOTE — ED Notes (Unsigned)
x2 attempt LM on pt's VM at [phone]... Also called [phone] and LM Need to give lab results from recent visit on 2/6  Per Dr. Dayton Scrape,  Please let patient know that chlamydia/gonorrhea/trichomonas tests were negative. LM  Mailed letter as 3rd attempt
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
## 2015-10-14 ENCOUNTER — Encounter (HOSPITAL_COMMUNITY): Payer: Self-pay

## 2015-10-14 ENCOUNTER — Ambulatory Visit (HOSPITAL_COMMUNITY)
Admission: EM | Admit: 2015-10-14 | Discharge: 2015-10-14 | Disposition: A | Discharge status: Home / Self Care | Payer: Self-pay | Attending: Emergency Medicine | Admitting: Emergency Medicine

## 2015-10-14 DIAGNOSIS — B009 Herpesviral infection, unspecified: Secondary | ICD-10-CM

## 2015-10-14 MED ORDER — VALACYCLOVIR HCL 1 G PO TABS: 1000.0000 mg | ORAL_TABLET | Freq: Two times a day (BID) | ORAL | Status: AC
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
## 2015-10-14 NOTE — ED Notes (Signed)
Patient presents with pain in left leg and lower abdomen x2 days, no nausea or vomiting. No acute distress

## 2015-10-14 NOTE — Discharge Instructions (Signed)
You hae a HSV infection. Will treat you with an anti-viral. Practice safe sex Genital Herpes  Genital herpes is a common sexually transmitted infection (STI) that is caused by a virus. The virus is spread from person to person through sexual contact. Infection can cause itching, blisters, and sores in the genital area or rectal area. This is called an outbreak. It affects both men and women.  Genital herpes is particularly concerning for pregnant women because the virus can be passed to the baby during delivery and cause serious problems. Genital herpes is also a concern for people with a weakened defense (immune) system.  Symptoms of genital herpes may last several days and then go away. However, the virus remains in your body, so you may have more outbreaks of symptoms in the future. The time between outbreaks varies and can be months or years.  CAUSES  Genital herpes is caused by a virus called herpes simplex virus (HSV) type 2 or HSV type 1. These viruses are contagious and are most often spread through sexual contact with an infected person. Sexual contact includes vaginal, anal, and oral sex.  RISK FACTORS  Risk factors for genital herpes include:  Being sexually active with multiple partners.  Having unprotected sex. SIGNS AND SYMPTOMS  Symptoms may include:  Pain and itching in the genital area or rectal area.  Small red bumps that turn into blisters and then turn into sores.  Flu-like symptoms, including:  Fever.  Body aches. Painful urination.  Vaginal discharge. DIAGNOSIS  Genital herpes may be diagnosed by:  Physical exam.  Blood test.  Fluid culture test from an open sore. TREATMENT  There is no cure for genital herpes. Oral antiviral medicines may be used to speed up healing and to help prevent the return of symptoms. These medicines can also help to reduce the spread of the virus to sexual partners.  HOME CARE INSTRUCTIONS  Keep the affected areas dry and clean.  Take  medicines only as directed by your health care provider.  Do not have sexual contact during active infections. Genital herpes is contagious.  Practice safe sex. Latex condoms and male condoms may help to prevent the spread of the herpes virus.  Avoid rubbing or touching the blisters and sores. If you do touch the blister or sores:  Wash your hands thoroughly.  Do not touch your eyes afterward. If you become pregnant, tell your health care provider if you have had genital herpes.  Keep all follow-up visits as directed by your health care provider. This is important. PREVENTION  Use condoms. Although anyone can contract genital herpes during sexual contact even with the use of a condom, a condom can provide some protection.  Avoid having multiple sexual partners.  Talk to your sexual partner about any symptoms and past history that either of you may have.  Get tested before you have sex. Ask your partner to do the same.  Recognize the symptoms of genital herpes. Do not have sexual contact if you notice these symptoms. SEEK MEDICAL CARE IF:  Your symptoms are not improving with medicine.  Your symptoms return.  You have new symptoms.  You have a fever.  You have abdominal pain.  You have redness, swelling, or pain in your eye. MAKE SURE YOU:  Understand these instructions.  Will watch your condition.  Will get help right away if you are not doing well or get worse. This information is not intended to replace advice given to you by your  health care provider. Make sure you discuss any questions you have with your health care provider.

## 2022-03-20 ENCOUNTER — Other Ambulatory Visit (HOSPITAL_COMMUNITY): Payer: Self-pay

## 2023-06-22 ENCOUNTER — Emergency Department (HOSPITAL_COMMUNITY): Payer: Medicaid Other | Admitting: Anesthesiology

## 2023-06-22 ENCOUNTER — Encounter (HOSPITAL_COMMUNITY): Payer: Self-pay

## 2023-06-22 ENCOUNTER — Emergency Department (HOSPITAL_COMMUNITY): Payer: Medicaid Other

## 2023-06-22 ENCOUNTER — Inpatient Hospital Stay (HOSPITAL_COMMUNITY): Payer: Medicaid Other

## 2023-06-22 ENCOUNTER — Encounter (HOSPITAL_COMMUNITY)
Admission: EM | Disposition: A | Discharge status: Home / Self Care | Payer: Self-pay | Source: Home / Self Care | Attending: Student

## 2023-06-22 ENCOUNTER — Other Ambulatory Visit: Payer: Self-pay

## 2023-06-22 ENCOUNTER — Inpatient Hospital Stay (HOSPITAL_COMMUNITY)
Admission: EM | Admit: 2023-06-22 | Discharge: 2023-06-25 | DRG: 493 | Disposition: A | Discharge status: Home / Self Care | Payer: MEDICAID | Attending: Student | Admitting: Student

## 2023-06-22 DIAGNOSIS — I10 Essential (primary) hypertension: Secondary | ICD-10-CM | POA: Diagnosis present

## 2023-06-22 DIAGNOSIS — Z6830 Body mass index (BMI) 30.0-30.9, adult: Secondary | ICD-10-CM

## 2023-06-22 DIAGNOSIS — W449XXA Unspecified foreign body entering into or through a natural orifice, initial encounter: Secondary | ICD-10-CM | POA: Diagnosis present

## 2023-06-22 DIAGNOSIS — F1090 Alcohol use, unspecified, uncomplicated: Secondary | ICD-10-CM | POA: Diagnosis present

## 2023-06-22 DIAGNOSIS — S82201C Unspecified fracture of shaft of right tibia, initial encounter for open fracture type IIIA, IIIB, or IIIC: Secondary | ICD-10-CM

## 2023-06-22 DIAGNOSIS — Z23 Encounter for immunization: Secondary | ICD-10-CM

## 2023-06-22 DIAGNOSIS — D62 Acute posthemorrhagic anemia: Secondary | ICD-10-CM | POA: Diagnosis not present

## 2023-06-22 DIAGNOSIS — S82101C Unspecified fracture of upper end of right tibia, initial encounter for open fracture type IIIA, IIIB, or IIIC: Secondary | ICD-10-CM | POA: Diagnosis not present

## 2023-06-22 DIAGNOSIS — Z833 Family history of diabetes mellitus: Secondary | ICD-10-CM | POA: Diagnosis not present

## 2023-06-22 DIAGNOSIS — F17210 Nicotine dependence, cigarettes, uncomplicated: Secondary | ICD-10-CM | POA: Diagnosis present

## 2023-06-22 DIAGNOSIS — E66811 Obesity, class 1: Secondary | ICD-10-CM | POA: Diagnosis present

## 2023-06-22 DIAGNOSIS — Y905 Blood alcohol level of 100-119 mg/100 ml: Secondary | ICD-10-CM | POA: Diagnosis present

## 2023-06-22 DIAGNOSIS — Y9355 Activity, bike riding: Secondary | ICD-10-CM

## 2023-06-22 DIAGNOSIS — S82261C Displaced segmental fracture of shaft of right tibia, initial encounter for open fracture type IIIA, IIIB, or IIIC: Secondary | ICD-10-CM | POA: Diagnosis present

## 2023-06-22 DIAGNOSIS — E669 Obesity, unspecified: Secondary | ICD-10-CM | POA: Diagnosis present

## 2023-06-22 DIAGNOSIS — S82401C Unspecified fracture of shaft of right fibula, initial encounter for open fracture type IIIA, IIIB, or IIIC: Secondary | ICD-10-CM

## 2023-06-22 DIAGNOSIS — Z880 Allergy status to penicillin: Secondary | ICD-10-CM

## 2023-06-22 DIAGNOSIS — Z8249 Family history of ischemic heart disease and other diseases of the circulatory system: Secondary | ICD-10-CM

## 2023-06-22 DIAGNOSIS — W3400XS Accidental discharge from unspecified firearms or gun, sequela: Secondary | ICD-10-CM | POA: Diagnosis not present

## 2023-06-22 DIAGNOSIS — S82201B Unspecified fracture of shaft of right tibia, initial encounter for open fracture type I or II: Secondary | ICD-10-CM

## 2023-06-22 DIAGNOSIS — S91111A Laceration without foreign body of right great toe without damage to nail, initial encounter: Secondary | ICD-10-CM | POA: Diagnosis present

## 2023-06-22 DIAGNOSIS — Z041 Encounter for examination and observation following transport accident: Secondary | ICD-10-CM | POA: Diagnosis not present

## 2023-06-22 DIAGNOSIS — S82291A Other fracture of shaft of right tibia, initial encounter for closed fracture: Secondary | ICD-10-CM | POA: Diagnosis not present

## 2023-06-22 DIAGNOSIS — T185XXA Foreign body in anus and rectum, initial encounter: Secondary | ICD-10-CM | POA: Diagnosis not present

## 2023-06-22 DIAGNOSIS — M79661 Pain in right lower leg: Secondary | ICD-10-CM | POA: Diagnosis not present

## 2023-06-22 DIAGNOSIS — S82101B Unspecified fracture of upper end of right tibia, initial encounter for open fracture type I or II: Secondary | ICD-10-CM | POA: Diagnosis not present

## 2023-06-22 DIAGNOSIS — Z9889 Other specified postprocedural states: Secondary | ICD-10-CM

## 2023-06-22 HISTORY — PX: EXTERNAL FIXATION LEG: SHX1549

## 2023-06-22 HISTORY — PX: I & D EXTREMITY: SHX5045

## 2023-06-22 HISTORY — DX: Essential (primary) hypertension: I10

## 2023-06-22 LAB — TYPE AND SCREEN
ABO/RH(D): O POS
Antibody Screen: NEGATIVE

## 2023-06-22 LAB — COMPREHENSIVE METABOLIC PANEL
ALT: 26 U/L (ref 0–44)
AST: 35 U/L (ref 15–41)
Albumin: 3.3 g/dL — ABNORMAL LOW (ref 3.5–5.0)
Alkaline Phosphatase: 38 U/L (ref 38–126)
Anion gap: 13 (ref 5–15)
BUN: 11 mg/dL (ref 6–20)
CO2: 20 mmol/L — ABNORMAL LOW (ref 22–32)
Calcium: 9 mg/dL (ref 8.9–10.3)
Chloride: 105 mmol/L (ref 98–111)
Creatinine, Ser: 1.54 mg/dL — ABNORMAL HIGH (ref 0.61–1.24)
GFR, Estimated: 58 mL/min — ABNORMAL LOW (ref >60)
Glucose, Bld: 146 mg/dL — ABNORMAL HIGH (ref 70–99)
Potassium: 3.1 mmol/L — ABNORMAL LOW (ref 3.5–5.1)
Sodium: 138 mmol/L (ref 135–145)
Total Bilirubin: 0.6 mg/dL (ref 0.0–1.2)
Total Protein: 6.8 g/dL (ref 6.5–8.1)

## 2023-06-22 LAB — I-STAT CHEM 8, ED
BUN: 14 mg/dL (ref 6–20)
Calcium, Ion: 1.08 mmol/L — ABNORMAL LOW (ref 1.15–1.40)
Chloride: 106 mmol/L (ref 98–111)
Creatinine, Ser: 1.7 mg/dL — ABNORMAL HIGH (ref 0.61–1.24)
Glucose, Bld: 141 mg/dL — ABNORMAL HIGH (ref 70–99)
HCT: 41 % (ref 39.0–52.0)
Hemoglobin: 13.9 g/dL (ref 13.0–17.0)
Potassium: 3.1 mmol/L — ABNORMAL LOW (ref 3.5–5.1)
Sodium: 141 mmol/L (ref 135–145)
TCO2: 20 mmol/L — ABNORMAL LOW (ref 22–32)

## 2023-06-22 LAB — CBC
HCT: 40.8 % (ref 39.0–52.0)
Hemoglobin: 13.1 g/dL (ref 13.0–17.0)
MCH: 27.8 pg (ref 26.0–34.0)
MCHC: 32.1 g/dL (ref 30.0–36.0)
MCV: 86.4 fL (ref 80.0–100.0)
Platelets: 247 10*3/uL (ref 150–400)
RBC: 4.72 MIL/uL (ref 4.22–5.81)
RDW: 14.3 % (ref 11.5–15.5)
WBC: 8.7 10*3/uL (ref 4.0–10.5)
nRBC: 0 % (ref 0.0–0.2)

## 2023-06-22 LAB — I-STAT CG4 LACTIC ACID, ED: Lactic Acid, Venous: 3.2 mmol/L (ref 0.5–1.9)

## 2023-06-22 LAB — ETHANOL: Alcohol, Ethyl (B): 114 mg/dL — ABNORMAL HIGH (ref <10)

## 2023-06-22 LAB — PROTIME-INR
INR: 1 (ref 0.8–1.2)
Prothrombin Time: 13.3 s (ref 11.4–15.2)

## 2023-06-22 LAB — ABO/RH: ABO/RH(D): O POS

## 2023-06-22 SURGERY — EXTERNAL FIXATION, LOWER EXTREMITY
Anesthesia: General | Site: Leg Lower | Laterality: Right

## 2023-06-22 MED ORDER — ACETAMINOPHEN 10 MG/ML IV SOLN
INTRAVENOUS | Status: AC
  Filled 2023-06-22: qty 100

## 2023-06-22 MED ORDER — FENTANYL CITRATE (PF) 250 MCG/5ML IJ SOLN
INTRAMUSCULAR | Status: AC
  Filled 2023-06-22: qty 5

## 2023-06-22 MED ORDER — 0.9 % SODIUM CHLORIDE (POUR BTL) OPTIME
TOPICAL | Status: DC | PRN
  Administered 2023-06-22: 1000 mL

## 2023-06-22 MED ORDER — SUGAMMADEX SODIUM 200 MG/2ML IV SOLN
INTRAVENOUS | Status: DC | PRN
  Administered 2023-06-22: 200 mg via INTRAVENOUS

## 2023-06-22 MED ORDER — PHENYLEPHRINE 80 MCG/ML (10ML) SYRINGE FOR IV PUSH (FOR BLOOD PRESSURE SUPPORT)
PREFILLED_SYRINGE | INTRAVENOUS | Status: DC | PRN
  Administered 2023-06-22 (×3): 160 ug via INTRAVENOUS

## 2023-06-22 MED ORDER — LACTATED RINGERS IV SOLN: INTRAVENOUS | Status: DC | PRN

## 2023-06-22 MED ORDER — ACETAMINOPHEN 10 MG/ML IV SOLN
INTRAVENOUS | Status: DC | PRN
  Administered 2023-06-22: 1000 mg via INTRAVENOUS

## 2023-06-22 MED ORDER — SODIUM CHLORIDE 0.9 % IR SOLN
Status: DC | PRN
  Administered 2023-06-22 (×3): 3000 mL

## 2023-06-22 MED ORDER — AMISULPRIDE (ANTIEMETIC) 5 MG/2ML IV SOLN: 10.0000 mg | Freq: Once | INTRAVENOUS | Status: DC | PRN

## 2023-06-22 MED ORDER — FENTANYL CITRATE (PF) 100 MCG/2ML IJ SOLN
25.0000 ug | INTRAMUSCULAR | Status: DC | PRN
  Administered 2023-06-23: 50 ug via INTRAVENOUS

## 2023-06-22 MED ORDER — TETANUS-DIPHTH-ACELL PERTUSSIS 5-2.5-18.5 LF-MCG/0.5 IM SUSY: 0.5000 mL | PREFILLED_SYRINGE | Freq: Once | INTRAMUSCULAR | Status: DC

## 2023-06-22 MED ORDER — SODIUM CHLORIDE 0.9 % IV SOLN
2.0000 g | Freq: Once | INTRAVENOUS | Status: AC
  Administered 2023-06-22: 2 g via INTRAVENOUS
  Filled 2023-06-22: qty 20

## 2023-06-22 MED ORDER — ONDANSETRON HCL 4 MG/2ML IJ SOLN
INTRAMUSCULAR | Status: DC | PRN
  Administered 2023-06-22: 4 mg via INTRAVENOUS

## 2023-06-22 MED ORDER — MIDAZOLAM HCL 2 MG/2ML IJ SOLN
INTRAMUSCULAR | Status: AC
  Filled 2023-06-22: qty 2

## 2023-06-22 MED ORDER — FENTANYL CITRATE (PF) 250 MCG/5ML IJ SOLN
INTRAMUSCULAR | Status: DC | PRN
  Administered 2023-06-22: 50 ug via INTRAVENOUS
  Administered 2023-06-22: 100 ug via INTRAVENOUS
  Administered 2023-06-22 (×3): 50 ug via INTRAVENOUS

## 2023-06-22 MED ORDER — FENTANYL CITRATE PF 50 MCG/ML IJ SOSY
100.0000 ug | PREFILLED_SYRINGE | INTRAMUSCULAR | Status: DC | PRN
  Administered 2023-06-22: 100 ug via INTRAVENOUS
  Filled 2023-06-22: qty 2

## 2023-06-22 MED ORDER — HYDROMORPHONE HCL 1 MG/ML IJ SOLN
1.0000 mg | Freq: Once | INTRAMUSCULAR | Status: AC
  Administered 2023-06-22: 1 mg via INTRAVENOUS
  Filled 2023-06-22: qty 1

## 2023-06-22 MED ORDER — PROPOFOL 10 MG/ML IV BOLUS
INTRAVENOUS | Status: DC | PRN
  Administered 2023-06-22: 50 mg via INTRAVENOUS
  Administered 2023-06-22: 100 mg via INTRAVENOUS

## 2023-06-22 MED ORDER — PROPOFOL 10 MG/ML IV BOLUS
INTRAVENOUS | Status: AC
  Filled 2023-06-22: qty 20

## 2023-06-22 MED ORDER — VANCOMYCIN HCL 1000 MG IV SOLR
INTRAVENOUS | Status: DC | PRN
  Administered 2023-06-22: 1000 mg via TOPICAL

## 2023-06-22 MED ORDER — VANCOMYCIN HCL 1000 MG IV SOLR
INTRAVENOUS | Status: AC
  Filled 2023-06-22: qty 20

## 2023-06-22 MED ORDER — ROCURONIUM BROMIDE 100 MG/10ML IV SOLN
INTRAVENOUS | Status: DC | PRN
  Administered 2023-06-22: 60 mg via INTRAVENOUS

## 2023-06-22 MED ORDER — DEXAMETHASONE SODIUM PHOSPHATE 4 MG/ML IJ SOLN
INTRAMUSCULAR | Status: DC | PRN
  Administered 2023-06-22: 10 mg via INTRAVENOUS

## 2023-06-22 MED ORDER — OXYCODONE HCL 5 MG PO TABS: 5.0000 mg | ORAL_TABLET | Freq: Once | ORAL | Status: DC | PRN

## 2023-06-22 MED ORDER — OXYCODONE HCL 5 MG/5ML PO SOLN: 5.0000 mg | Freq: Once | ORAL | Status: DC | PRN

## 2023-06-22 MED ORDER — MIDAZOLAM HCL 5 MG/5ML IJ SOLN
INTRAMUSCULAR | Status: DC | PRN
  Administered 2023-06-22: 2 mg via INTRAVENOUS

## 2023-06-22 MED ORDER — LIDOCAINE HCL (CARDIAC) PF 50 MG/5ML IV SOSY
PREFILLED_SYRINGE | INTRAVENOUS | Status: DC | PRN
  Administered 2023-06-22: 60 mg via INTRAVENOUS

## 2023-06-22 MED ORDER — IOHEXOL 350 MG/ML SOLN
75.0000 mL | Freq: Once | INTRAVENOUS | Status: AC | PRN
  Administered 2023-06-22: 75 mL via INTRAVENOUS

## 2023-06-22 MED ORDER — TRANEXAMIC ACID-NACL 1000-0.7 MG/100ML-% IV SOLN
1000.0000 mg | INTRAVENOUS | Status: AC
  Administered 2023-06-23: 1000 mg via INTRAVENOUS
  Filled 2023-06-22: qty 100

## 2023-06-22 MED ORDER — SODIUM CHLORIDE 0.9 % IV SOLN: INTRAVENOUS | Status: DC | PRN

## 2023-06-22 SURGICAL SUPPLY — 79 items
ALCOHOL 70% 16 OZ (MISCELLANEOUS) ×2 IMPLANT
BAG COUNTER SPONGE SURGICOUNT (BAG) ×2 IMPLANT
BAR GLASS FIBER EXFX 11X600 (EXFIX) IMPLANT
BLADE SURG 10 STRL SS (BLADE) ×2 IMPLANT
BNDG COHESIVE 4X5 TAN STRL (GAUZE/BANDAGES/DRESSINGS) ×2 IMPLANT
BNDG COHESIVE 4X5 TAN STRL LF (GAUZE/BANDAGES/DRESSINGS) IMPLANT
BNDG ELASTIC 4X5.8 VLCR STR LF (GAUZE/BANDAGES/DRESSINGS) ×2 IMPLANT
BNDG ELASTIC 6X10 VLCR STRL LF (GAUZE/BANDAGES/DRESSINGS) IMPLANT
BNDG ELASTIC 6X5.8 VLCR STR LF (GAUZE/BANDAGES/DRESSINGS) ×2 IMPLANT
BNDG GAUZE DERMACEA FLUFF 4 (GAUZE/BANDAGES/DRESSINGS) ×2 IMPLANT
CANISTER WOUNDNEG PRESSURE 500 (CANNISTER) IMPLANT
CHLORAPREP W/TINT 26 (MISCELLANEOUS) ×2 IMPLANT
COVER MAYO STAND STRL (DRAPES) ×2 IMPLANT
COVER SURGICAL LIGHT HANDLE (MISCELLANEOUS) ×4 IMPLANT
CUFF TOURN SGL QUICK 42 (TOURNIQUET CUFF) IMPLANT
CUFF TRNQT CYL 34X4.125X (TOURNIQUET CUFF) IMPLANT
DRAPE C-ARM 42X72 X-RAY (DRAPES) ×2 IMPLANT
DRAPE C-ARMOR (DRAPES) IMPLANT
DRAPE DERMATAC (DRAPES) IMPLANT
DRAPE HALF SHEET 40X57 (DRAPES) ×2 IMPLANT
DRAPE IMP U-DRAPE 54X76 (DRAPES) ×2 IMPLANT
DRAPE U-SHAPE 47X51 STRL (DRAPES) ×2 IMPLANT
DRESSING OPSITE X SMALL 2X3 (GAUZE/BANDAGES/DRESSINGS) ×2 IMPLANT
DRSG ADAPTIC 3X8 NADH LF (GAUZE/BANDAGES/DRESSINGS) ×2 IMPLANT
DRSG TEGADERM 4X4.75 (GAUZE/BANDAGES/DRESSINGS) ×4 IMPLANT
DRSG VAC GRANUFOAM MED (GAUZE/BANDAGES/DRESSINGS) IMPLANT
DURAPREP 26ML APPLICATOR (WOUND CARE) ×2 IMPLANT
ELECT CAUTERY BLADE 6.4 (BLADE) ×2 IMPLANT
ELECT REM PT RETURN 9FT ADLT (ELECTROSURGICAL) ×2
ELECTRODE REM PT RTRN 9FT ADLT (ELECTROSURGICAL) ×2 IMPLANT
GAUZE PAD ABD 8X10 STRL (GAUZE/BANDAGES/DRESSINGS) IMPLANT
GAUZE SPONGE 4X4 12PLY STRL (GAUZE/BANDAGES/DRESSINGS) ×2 IMPLANT
GAUZE XEROFORM 5X9 LF (GAUZE/BANDAGES/DRESSINGS) IMPLANT
GLOVE BIO SURGEON STRL SZ 6.5 (GLOVE) IMPLANT
GLOVE BIO SURGEON STRL SZ7.5 (GLOVE) ×2 IMPLANT
GLOVE BIOGEL PI IND STRL 7.0 (GLOVE) IMPLANT
GLOVE BIOGEL PI IND STRL 7.5 (GLOVE) ×2 IMPLANT
GLOVE BIOGEL PI IND STRL 8 (GLOVE) ×2 IMPLANT
GLOVE INDICATOR 7.5 STRL GRN (GLOVE) ×2 IMPLANT
GLOVE SURG SS PI 7.0 STRL IVOR (GLOVE) IMPLANT
GLOVE SURG SYN 7.5 E (GLOVE) ×2 IMPLANT
GLOVE SURG SYN 7.5 PF PI (GLOVE) ×2 IMPLANT
GOWN STRL REUS W/ TWL LRG LVL3 (GOWN DISPOSABLE) ×6 IMPLANT
GOWN STRL REUS W/ TWL XL LVL3 (GOWN DISPOSABLE) ×4 IMPLANT
HALF PIN 5.0X160 (EXFIX) IMPLANT
KIT BASIN OR (CUSTOM PROCEDURE TRAY) ×2 IMPLANT
KIT SIGMOIDOSCOPE (SET/KITS/TRAYS/PACK) IMPLANT
KIT TURNOVER KIT B (KITS) ×2 IMPLANT
MANIFOLD NEPTUNE II (INSTRUMENTS) ×2 IMPLANT
NDL 22X1.5 STRL (OR ONLY) (MISCELLANEOUS) IMPLANT
NEEDLE 22X1.5 STRL (OR ONLY) (MISCELLANEOUS) IMPLANT
NS IRRIG 1000ML POUR BTL (IV SOLUTION) ×2 IMPLANT
PACK ORTHO EXTREMITY (CUSTOM PROCEDURE TRAY) ×2 IMPLANT
PACK UNIVERSAL I (CUSTOM PROCEDURE TRAY) ×2 IMPLANT
PAD ARMBOARD 7.5X6 YLW CONV (MISCELLANEOUS) ×4 IMPLANT
PAD CAST 4YDX4 CTTN HI CHSV (CAST SUPPLIES) ×2 IMPLANT
PIN CLAMP 2BAR 75MM BLUE (EXFIX) IMPLANT
PIN HALF YELLOW 5X160X35 (EXFIX) IMPLANT
RESTRAINT HEAD UNIVERSAL NS (MISCELLANEOUS) ×2 IMPLANT
SOL PREP POV-IOD 4OZ 10% (MISCELLANEOUS) IMPLANT
SPONGE T-LAP 18X18 ~~LOC~~+RFID (SPONGE) ×4 IMPLANT
SPONGE T-LAP 4X18 ~~LOC~~+RFID (SPONGE) IMPLANT
STAPLER VISISTAT 35W (STAPLE) ×2 IMPLANT
STOCKINETTE IMPERVIOUS 9X36 MD (GAUZE/BANDAGES/DRESSINGS) IMPLANT
SUCTION TUBE FRAZIER 10FR DISP (SUCTIONS) IMPLANT
SUT ETHILON 2 0 FS 18 (SUTURE) IMPLANT
SUT MON AB 2-0 CT1 27 (SUTURE) ×2 IMPLANT
SUT PDS AB 0 CT1 27 (SUTURE) IMPLANT
SUT PDS AB 2-0 CT1 27 (SUTURE) IMPLANT
SUT VIC AB 0 CT1 18XCR BRD 8 (SUTURE) ×2 IMPLANT
SUT VIC AB 0 CT1 27XBRD ANBCTR (SUTURE) ×2 IMPLANT
SUT VIC AB 2-0 CT1 18 (SUTURE) ×2 IMPLANT
SYR BULB IRRIG 60ML STRL (SYRINGE) ×2 IMPLANT
TOWEL GREEN STERILE (TOWEL DISPOSABLE) ×2 IMPLANT
TOWEL GREEN STERILE FF (TOWEL DISPOSABLE) ×2 IMPLANT
TOWEL OR NON WOVEN STRL DISP B (DISPOSABLE) ×2 IMPLANT
TUBE CONNECTING 12X1/4 (SUCTIONS) ×2 IMPLANT
WATER STERILE IRR 1000ML POUR (IV SOLUTION) ×2 IMPLANT
YANKAUER SUCT BULB TIP NO VENT (SUCTIONS) ×2 IMPLANT
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
## 2023-06-22 NOTE — ED Notes (Addendum)
Patient received fentanyl pta by ems, patient also completed the 2 grams of Ancef that was started by EMS pta
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
## 2023-06-22 NOTE — Anesthesia Procedure Notes (Signed)
Procedure Name: Intubation Date/Time: 06/22/2023 8:47 PM  Performed by: Edmonia Caprio, CRNAPre-anesthesia Checklist: Emergency Drugs available, Patient identified, Suction available, Patient being monitored and Timeout performed Patient Re-evaluated:Patient Re-evaluated prior to induction Oxygen Delivery Method: Circle system utilized Preoxygenation: Pre-oxygenation with 100% oxygen Induction Type: IV induction Ventilation: Mask ventilation without difficulty Laryngoscope Size: Miller and 2 Grade View: Grade I Tube type: Oral Tube size: 7.5 mm Number of attempts: 1 Airway Equipment and Method: Stylet Placement Confirmation: ETT inserted through vocal cords under direct vision, positive ETCO2 and breath sounds checked- equal and bilateral Secured at: 23 cm Tube secured with: Tape Dental Injury: Teeth and Oropharynx as per pre-operative assessment
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
## 2023-06-22 NOTE — Anesthesia Preprocedure Evaluation (Addendum)
Anesthesia Evaluation  Patient identified by MRN, date of birth, ID band Patient awake    Reviewed: Allergy & Precautions, NPO status , Patient's Chart, lab work & pertinent test results  Airway Mallampati: II  TM Distance: >3 FB     Dental  (+) Teeth Intact, Dental Advisory Given   Pulmonary Current SmokerPatient did not abstain from smoking.   Pulmonary exam normal        Cardiovascular negative cardio ROS Normal cardiovascular exam     Neuro/Psych negative neurological ROS  negative psych ROS   GI/Hepatic negative GI ROS,,,(+)     substance abuse    Endo/Other  negative endocrine ROS    Renal/GU Renal InsufficiencyRenal disease     Musculoskeletal   Abdominal  (+) + obese  Peds  Hematology negative hematology ROS (+)   Anesthesia Other Findings right open tibia fracture  Reproductive/Obstetrics                             Anesthesia Physical Anesthesia Plan  ASA: 2 and emergent  Anesthesia Plan: General   Post-op Pain Management:    Induction: Intravenous  PONV Risk Score and Plan: 1 and Ondansetron, Dexamethasone, Midazolam and Treatment may vary due to age or medical condition  Airway Management Planned: Oral ETT  Additional Equipment:   Intra-op Plan:   Post-operative Plan: Extubation in OR  Informed Consent: I have reviewed the patients History and Physical, chart, labs and discussed the procedure including the risks, benefits and alternatives for the proposed anesthesia with the patient or authorized representative who has indicated his/her understanding and acceptance.     Dental advisory given  Plan Discussed with: CRNA  Anesthesia Plan Comments: (Patient takes acyclovir for HSV. Per patient he hides this medication in his rectum due to "embarrassment." GI and surgery called for assistance at removal. Patient prefers to remove himself. One bag removed from  rectum at ~ 1900. Patient unable to remove the second bag and accepts manual extraction by surgeon. -RE )       Anesthesia Quick Evaluation

## 2023-06-22 NOTE — Transfer of Care (Signed)
Immediate Anesthesia Transfer of Care Note  Patient: Rod Holler  Procedure(s) Performed: EXTERNAL FIXATION LEG (Right: Leg Lower) IRRIGATION AND DEBRIDEMENT RIGHT LOWER LEG (Right)  Patient Location: PACU  Anesthesia Type:General  Level of Consciousness: sedated and responds to stimulation  Airway & Oxygen Therapy: Patient Spontanous Breathing and Patient connected to nasal cannula oxygen  Post-op Assessment: Report given to RN and Post -op Vital signs reviewed and stable  Post vital signs: Reviewed and stable  Last Vitals:  Vitals Value Taken Time  BP 133/91 06/22/23 2338  Temp    Pulse 91 06/22/23 2339  Resp 25 06/22/23 2340  SpO2 98 % 06/22/23 2339  Vitals shown include unfiled device data.  Last Pain:  Vitals:   06/22/23 1706  TempSrc:   PainSc: 10-Worst pain ever         Complications: No notable events documented.

## 2023-06-22 NOTE — Discharge Instructions (Addendum)
[facility] Discharge Instructions   General Discharge Instructions  WEIGHT BEARING STATUS:Non-weightbearing right lower extremity  RANGE OF MOTION/ACTIVITY: Ok for knee and ankle range of motion as tolerated  Wound Care: Leave dressing in place until follow-up. Incisions can be left open to air if there is no drainage. Once the incision is completely dry and without drainage, it may be left open to air out.  OK to shower, keep right leg covered and dry while showering. Clean incision gently with soap and water.  DVT/PE prophylaxis: Aspirin 325 mg daily x 30 days  Diet: as you were eating previously.  Can use over the counter stool softeners and bowel preparations, such as Miralax, to help with bowel movements.  Narcotics can be constipating.  Be sure to drink plenty of fluids  PAIN MEDICATION USE AND EXPECTATIONS  You have likely been given narcotic medications to help control your pain.  After a traumatic event that results in an fracture (broken bone) with or without surgery, it is ok to use narcotic pain medications to help control one's pain.  We understand that everyone responds to pain differently and each individual patient will be evaluated on a regular basis for the continued need for narcotic medications. Ideally, narcotic medication use should last no more than 6-8 weeks (coinciding with fracture healing).   As a patient it is your responsibility as well to monitor narcotic medication use and report the amount and frequency you use these medications when you come to your office visit.   We would also advise that if you are using narcotic medications, you should take a dose prior to therapy to maximize you participation.  IF YOU ARE ON NARCOTIC MEDICATIONS IT IS NOT PERMISSIBLE TO OPERATE A MOTOR VEHICLE (MOTORCYCLE/CAR/TRUCK/MOPED) OR HEAVY MACHINERY DO NOT MIX NARCOTICS WITH OTHER CNS (CENTRAL NERVOUS SYSTEM) DEPRESSANTS SUCH AS ALCOHOL   STOP SMOKING OR USING  NICOTINE PRODUCTS!!!!  As discussed nicotine severely impairs your body's ability to heal surgical and traumatic wounds but also impairs bone healing.  Wounds and bone heal by forming microscopic blood vessels (angiogenesis) and nicotine is a vasoconstrictor (essentially, shrinks blood vessels).  Therefore, if vasoconstriction occurs to these microscopic blood vessels they essentially disappear and are unable to deliver necessary nutrients to the healing tissue.  This is one modifiable factor that you can do to dramatically increase your chances of healing your injury.    (This means no smoking, no nicotine gum, patches, etc)  DO NOT USE NONSTEROIDAL ANTI-INFLAMMATORY DRUGS (NSAID'S)  Using products such as Advil (ibuprofen), Aleve (naproxen), Motrin (ibuprofen) for additional pain control during fracture healing can delay and/or prevent the healing response.  If you would like to take over the counter (OTC) medication, Tylenol (acetaminophen) is ok.  However, some narcotic medications that are given for pain control contain acetaminophen as well. Therefore, you should not exceed more than 4000 mg of tylenol in a day if you do not have liver disease.  Also note that there are may OTC medicines, such as cold medicines and allergy medicines that my contain tylenol as well.  If you have any questions about medications and/or interactions please ask your doctor/PA or your pharmacist.      ICE AND ELEVATE INJURED/OPERATIVE EXTREMITY  Using ice and elevating the injured extremity above your heart can help with swelling and pain control.  Icing in a pulsatile fashion, such as 20 minutes on and 20 minutes off, can be followed.    Do not place  ice directly on skin. Make sure there is a barrier between to skin and the ice pack.    Using frozen items such as frozen peas works well as the conform nicely to the are that needs to be iced.  USE AN ACE WRAP OR TED HOSE FOR SWELLING CONTROL  In addition to icing and  elevation, Ace wraps or TED hose are used to help limit and resolve swelling.  It is recommended to use Ace wraps or TED hose until you are informed to stop.    When using Ace Wraps start the wrapping distally (farthest away from the body) and wrap proximally (closer to the body)   Example: If you had surgery on your leg or thing and you do not have a splint on, start the ace wrap at the toes and work your way up to the thigh        If you had surgery on your upper extremity and do not have a splint on, start the ace wrap at your fingers and work your way up to the upper arm  CALL THE OFFICE WITH ANY QUESTIONS OR CONCERNS: [phone]   VISIT OUR WEBSITE FOR ADDITIONAL INFORMATION: [email or web address]    Discharge Wound Care Instructions  Do NOT apply any ointments, solutions or lotions to pin sites or surgical wounds.  These prevent needed drainage and even though solutions like hydrogen peroxide kill bacteria, they also damage cells lining the pin sites that help fight infection.  Applying lotions or ointments can keep the wounds moist and can cause them to breakdown and open up as well. This can increase the risk for infection. When in doubt call the office.  Surgical incisions should be dressed daily.  If any drainage is noted, use one layer of adaptic or Mepitel, then gauze, Kerlix, and an ace wrap. - These dressing supplies should be available at local medical supply stores [facility], [facility], etc) as well as Insurance claims handler ([facility], [facility], [place], etc)  Once the incision is completely dry and without drainage, it may be left open to air out.  Showering may begin 36-48 hours later.  Cleaning gently with soap and water.  Traumatic wounds should be dressed daily as well.    One layer of adaptic, gauze, Kerlix, then ace wrap.  The adaptic can be discontinued once the draining has ceased    If you have a wet to dry dressing: wet the gauze with saline the squeeze as much  saline out so the gauze is moist (not soaking wet), place moistened gauze over wound, then place a dry gauze over the moist one, followed by Kerlix wrap, then ace wrap.

## 2023-06-22 NOTE — ED Notes (Signed)
Tourniquet released 1612
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
## 2023-06-22 NOTE — Op Note (Signed)
   Operative Note   Date: 06/22/2023  Procedure: exam under anesthesia, removal of rectal foreign body  Pre-op diagnosis: rectal foreign body Post-op diagnosis: same  Indication and clinical history: The patient is a 43 y.o. year old male with rectal foreign body     Surgeon: Diamantina Monks, MD  Anesthesiologist: Bradley Ferris, MD Anesthesia: MAC  Findings:  Specimen: none Explant: bag of pills consistent with clinical history EBL: 0cc Drains/Implants: none  Disposition: PACU - hemodynamically stable.  Description of procedure: The patient was positioned laterally left hip and knee flexed. MAC anesthetic induction was uneventful. Time-out was performed verifying correct patient, procedure, signature of informed consent.   Digital exploration was performed and a foreign body confirmed. Further exploration was able to yield manual extraction of the foreign body which was consistent with a bag of pills and the clinical history previously provided. The foreign body was discarded. The patient tolerated the procedure well. There were no complications.   Diamantina Monks, MD General and Trauma Surgery [facility]
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
## 2023-06-22 NOTE — Brief Op Note (Signed)
06/22/2023  11:21 PM  PATIENT:  Alexander Stone  43 y.o. male  PRE-OPERATIVE DIAGNOSIS:  right open tibia fracture  POST-OPERATIVE DIAGNOSIS:  right open tibia fracture  PROCEDURE:  Procedure(s): EXTERNAL FIXATION LEG (Right) IRRIGATION AND DEBRIDEMENT RIGHT LOWER LEG (Right)  SURGEON:  Surgeons and Role:    London Sheer, MD - Primary  PHYSICIAN ASSISTANT: none  ASSISTANTS: none   ANESTHESIA:   general  EBL:  50cc   BLOOD ADMINISTERED:none  DRAINS: none   LOCAL MEDICATIONS USED:  NONE  SPECIMEN:  No Specimen  DISPOSITION OF SPECIMEN:  N/A  COUNTS:  YES  TOURNIQUET: NONE  DICTATION: .Note written in EPIC  PLAN OF CARE: Admit to inpatient   PATIENT DISPOSITION:  PACU - hemodynamically stable.   Delay start of Pharmacological VTE agent (>24hrs) due to surgical blood loss or risk of bleeding: no

## 2023-06-22 NOTE — Progress Notes (Signed)
Orthopedic Tech Progress Note Patient Details:  [name] 1980/06/22 [id number]  Patient ID: Alexander Stone, male   DOB: 20-Jan-1981, 43 y.o.   MRN: [id number] Assisted with patient to and from CT, no longer needed after that because patient went to surgery.  [name] 06/22/2023, 6:15 PM

## 2023-06-23 ENCOUNTER — Inpatient Hospital Stay (HOSPITAL_COMMUNITY): Payer: Medicaid Other

## 2023-06-23 ENCOUNTER — Encounter (HOSPITAL_COMMUNITY)
Admission: EM | Disposition: A | Discharge status: Home / Self Care | Payer: Self-pay | Source: Home / Self Care | Attending: Student

## 2023-06-23 ENCOUNTER — Encounter (HOSPITAL_COMMUNITY): Payer: Self-pay | Admitting: Orthopedic Surgery

## 2023-06-23 DIAGNOSIS — S82101B Unspecified fracture of upper end of right tibia, initial encounter for open fracture type I or II: Secondary | ICD-10-CM | POA: Diagnosis not present

## 2023-06-23 DIAGNOSIS — T185XXA Foreign body in anus and rectum, initial encounter: Secondary | ICD-10-CM

## 2023-06-23 HISTORY — PX: TIBIA IM NAIL INSERTION: SHX2516

## 2023-06-23 HISTORY — PX: APPLICATION OF WOUND VAC: SHX5189

## 2023-06-23 HISTORY — PX: EXTERNAL FIXATION REMOVAL: SHX5040

## 2023-06-23 HISTORY — PX: I & D EXTREMITY: SHX5045

## 2023-06-23 LAB — BASIC METABOLIC PANEL
Anion gap: 9 (ref 5–15)
BUN: 13 mg/dL (ref 6–20)
CO2: 24 mmol/L (ref 22–32)
Calcium: 8.8 mg/dL — ABNORMAL LOW (ref 8.9–10.3)
Chloride: 103 mmol/L (ref 98–111)
Creatinine, Ser: 1.58 mg/dL — ABNORMAL HIGH (ref 0.61–1.24)
GFR, Estimated: 56 mL/min — ABNORMAL LOW (ref >60)
Glucose, Bld: 152 mg/dL — ABNORMAL HIGH (ref 70–99)
Potassium: 4.5 mmol/L (ref 3.5–5.1)
Sodium: 136 mmol/L (ref 135–145)

## 2023-06-23 LAB — URINALYSIS, ROUTINE W REFLEX MICROSCOPIC
Bilirubin Urine: NEGATIVE
Glucose, UA: NEGATIVE mg/dL
Hgb urine dipstick: NEGATIVE
Ketones, ur: NEGATIVE mg/dL
Leukocytes,Ua: NEGATIVE
Nitrite: NEGATIVE
Protein, ur: NEGATIVE mg/dL
Specific Gravity, Urine: >1.046 — ABNORMAL HIGH (ref 1.005–1.030)
pH: 5 (ref 5.0–8.0)

## 2023-06-23 LAB — CBC
HCT: 36.4 % — ABNORMAL LOW (ref 39.0–52.0)
Hemoglobin: 11.6 g/dL — ABNORMAL LOW (ref 13.0–17.0)
MCH: 27.4 pg (ref 26.0–34.0)
MCHC: 31.9 g/dL (ref 30.0–36.0)
MCV: 85.8 fL (ref 80.0–100.0)
Platelets: 232 10*3/uL (ref 150–400)
RBC: 4.24 MIL/uL (ref 4.22–5.81)
RDW: 14.8 % (ref 11.5–15.5)
WBC: 8.9 10*3/uL (ref 4.0–10.5)
nRBC: 0 % (ref 0.0–0.2)

## 2023-06-23 LAB — VITAMIN D 25 HYDROXY (VIT D DEFICIENCY, FRACTURES): Vit D, 25-Hydroxy: 20.76 ng/mL — ABNORMAL LOW (ref 30–100)

## 2023-06-23 LAB — HIV ANTIBODY (ROUTINE TESTING W REFLEX): HIV Screen 4th Generation wRfx: NONREACTIVE

## 2023-06-23 SURGERY — INSERTION, INTRAMEDULLARY ROD, TIBIA
Anesthesia: General | Site: Leg Lower | Laterality: Right

## 2023-06-23 MED ORDER — HYDROMORPHONE HCL 1 MG/ML IJ SOLN
0.2500 mg | INTRAMUSCULAR | Status: DC | PRN
  Administered 2023-06-23 (×2): 0.5 mg via INTRAVENOUS

## 2023-06-23 MED ORDER — ONDANSETRON HCL 4 MG/2ML IJ SOLN
4.0000 mg | Freq: Once | INTRAMUSCULAR | Status: DC | PRN
Start: 2023-06-23 — End: 2023-06-23

## 2023-06-23 MED ORDER — DEXAMETHASONE SODIUM PHOSPHATE 10 MG/ML IJ SOLN
INTRAMUSCULAR | Status: DC | PRN
  Administered 2023-06-23: 4 mg via INTRAVENOUS

## 2023-06-23 MED ORDER — KETOROLAC TROMETHAMINE 15 MG/ML IJ SOLN
15.0000 mg | Freq: Four times a day (QID) | INTRAMUSCULAR | Status: AC
  Administered 2023-06-23 – 2023-06-24 (×4): 15 mg via INTRAVENOUS
  Filled 2023-06-23 (×4): qty 1

## 2023-06-23 MED ORDER — SODIUM CHLORIDE 0.9 % IR SOLN
Status: DC | PRN
  Administered 2023-06-23: 3000 mL

## 2023-06-23 MED ORDER — HYDROMORPHONE HCL 1 MG/ML IJ SOLN
INTRAMUSCULAR | Status: AC
  Filled 2023-06-23: qty 1

## 2023-06-23 MED ORDER — SODIUM CHLORIDE 0.9 % IV SOLN: 2.0000 g | INTRAVENOUS | Status: DC

## 2023-06-23 MED ORDER — TOBRAMYCIN SULFATE 1.2 G IJ SOLR
INTRAMUSCULAR | Status: DC | PRN
  Administered 2023-06-23: 1.2 g via TOPICAL

## 2023-06-23 MED ORDER — VANCOMYCIN HCL 1000 MG IV SOLR
INTRAVENOUS | Status: DC | PRN
  Administered 2023-06-23: 1000 mg via TOPICAL

## 2023-06-23 MED ORDER — DOCUSATE SODIUM 100 MG PO CAPS
100.0000 mg | ORAL_CAPSULE | Freq: Two times a day (BID) | ORAL | Status: DC
Start: 2023-06-23 — End: 2023-06-25
  Administered 2023-06-23 – 2023-06-24 (×4): 100 mg via ORAL
  Filled 2023-06-23 (×5): qty 1

## 2023-06-23 MED ORDER — FENTANYL CITRATE (PF) 250 MCG/5ML IJ SOLN
INTRAMUSCULAR | Status: DC | PRN
  Administered 2023-06-23 (×2): 50 ug via INTRAVENOUS

## 2023-06-23 MED ORDER — OXYCODONE HCL 5 MG PO TABS
10.0000 mg | ORAL_TABLET | ORAL | Status: DC | PRN
  Administered 2023-06-23 – 2023-06-25 (×8): 15 mg via ORAL
  Filled 2023-06-23 (×8): qty 3

## 2023-06-23 MED ORDER — ONDANSETRON HCL 4 MG/2ML IJ SOLN
4.0000 mg | Freq: Four times a day (QID) | INTRAMUSCULAR | Status: DC | PRN
  Administered 2023-06-23: 4 mg via INTRAVENOUS
  Filled 2023-06-23: qty 2

## 2023-06-23 MED ORDER — METOCLOPRAMIDE HCL 5 MG/ML IJ SOLN: 5.0000 mg | Freq: Three times a day (TID) | INTRAMUSCULAR | Status: DC | PRN

## 2023-06-23 MED ORDER — CHLORHEXIDINE GLUCONATE 0.12 % MT SOLN
OROMUCOSAL | Status: AC
  Administered 2023-06-23: 15 mL via OROMUCOSAL
  Filled 2023-06-23: qty 15

## 2023-06-23 MED ORDER — ORAL CARE MOUTH RINSE: 15.0000 mL | Freq: Once | OROMUCOSAL | Status: AC

## 2023-06-23 MED ORDER — ONDANSETRON HCL 4 MG/2ML IJ SOLN: 4.0000 mg | Freq: Four times a day (QID) | INTRAMUSCULAR | Status: DC | PRN

## 2023-06-23 MED ORDER — OXYCODONE HCL 5 MG PO TABS: 5.0000 mg | ORAL_TABLET | ORAL | Status: DC | PRN

## 2023-06-23 MED ORDER — METHOCARBAMOL 500 MG PO TABS
500.0000 mg | ORAL_TABLET | Freq: Four times a day (QID) | ORAL | Status: DC | PRN
  Administered 2023-06-23 – 2023-06-25 (×3): 500 mg via ORAL
  Filled 2023-06-23 (×3): qty 1

## 2023-06-23 MED ORDER — TOBRAMYCIN SULFATE 1.2 G IJ SOLR
INTRAMUSCULAR | Status: AC
  Filled 2023-06-23: qty 1.2

## 2023-06-23 MED ORDER — DEXMEDETOMIDINE HCL IN NACL 80 MCG/20ML IV SOLN
INTRAVENOUS | Status: DC | PRN
  Administered 2023-06-23 (×3): 8 ug via INTRAVENOUS
  Administered 2023-06-23: 4 ug via INTRAVENOUS

## 2023-06-23 MED ORDER — DIPHENHYDRAMINE HCL 12.5 MG/5ML PO ELIX
12.5000 mg | ORAL_SOLUTION | ORAL | Status: DC | PRN
  Administered 2023-06-23 – 2023-06-24 (×3): 25 mg via ORAL
  Filled 2023-06-23 (×3): qty 10

## 2023-06-23 MED ORDER — ENOXAPARIN SODIUM 40 MG/0.4ML IJ SOSY: 40.0000 mg | PREFILLED_SYRINGE | Freq: Every day | INTRAMUSCULAR | Status: DC

## 2023-06-23 MED ORDER — HYDRALAZINE HCL 10 MG PO TABS: 10.0000 mg | ORAL_TABLET | Freq: Four times a day (QID) | ORAL | Status: DC | PRN

## 2023-06-23 MED ORDER — METHOCARBAMOL 1000 MG/10ML IJ SOLN
500.0000 mg | Freq: Four times a day (QID) | INTRAMUSCULAR | Status: DC | PRN
Start: 2023-06-23 — End: 2023-06-25
  Administered 2023-06-23: 500 mg via INTRAVENOUS
  Filled 2023-06-23: qty 10

## 2023-06-23 MED ORDER — SODIUM CHLORIDE 0.9 % IV SOLN
2.0000 g | INTRAVENOUS | Status: DC
  Administered 2023-06-23 – 2023-06-24 (×2): 2 g via INTRAVENOUS
  Filled 2023-06-23 (×2): qty 20

## 2023-06-23 MED ORDER — LACTATED RINGERS IV SOLN: INTRAVENOUS | Status: AC

## 2023-06-23 MED ORDER — HYDROMORPHONE HCL 1 MG/ML IJ SOLN
0.5000 mg | INTRAMUSCULAR | Status: DC | PRN
  Administered 2023-06-23 (×2): 0.5 mg via INTRAVENOUS
  Filled 2023-06-23 (×2): qty 0.5

## 2023-06-23 MED ORDER — ACETAMINOPHEN 500 MG PO TABS
1000.0000 mg | ORAL_TABLET | Freq: Four times a day (QID) | ORAL | Status: DC
  Administered 2023-06-23 – 2023-06-25 (×9): 1000 mg via ORAL
  Filled 2023-06-23 (×9): qty 2

## 2023-06-23 MED ORDER — OXYCODONE HCL 5 MG/5ML PO SOLN: 5.0000 mg | Freq: Once | ORAL | Status: DC | PRN

## 2023-06-23 MED ORDER — ENOXAPARIN SODIUM 40 MG/0.4ML IJ SOSY: 40.0000 mg | PREFILLED_SYRINGE | INTRAMUSCULAR | Status: DC

## 2023-06-23 MED ORDER — NICOTINE 7 MG/24HR TD PT24
7.0000 mg | MEDICATED_PATCH | Freq: Every day | TRANSDERMAL | Status: DC
  Administered 2023-06-23 – 2023-06-25 (×3): 7 mg via TRANSDERMAL
  Filled 2023-06-23 (×3): qty 1

## 2023-06-23 MED ORDER — MIDAZOLAM HCL 2 MG/2ML IJ SOLN
INTRAMUSCULAR | Status: AC
Start: 2023-06-23 — End: ?
  Filled 2023-06-23: qty 2

## 2023-06-23 MED ORDER — ONDANSETRON HCL 4 MG PO TABS: 4.0000 mg | ORAL_TABLET | Freq: Four times a day (QID) | ORAL | Status: DC | PRN

## 2023-06-23 MED ORDER — DROPERIDOL 2.5 MG/ML IJ SOLN: 0.6250 mg | Freq: Once | INTRAMUSCULAR | Status: DC | PRN

## 2023-06-23 MED ORDER — 0.9 % SODIUM CHLORIDE (POUR BTL) OPTIME
TOPICAL | Status: DC | PRN
  Administered 2023-06-23: 1000 mL

## 2023-06-23 MED ORDER — PROPOFOL 10 MG/ML IV BOLUS
INTRAVENOUS | Status: DC | PRN
  Administered 2023-06-23: 200 mg via INTRAVENOUS

## 2023-06-23 MED ORDER — CHLORHEXIDINE GLUCONATE 0.12 % MT SOLN: 15.0000 mL | Freq: Once | OROMUCOSAL | Status: AC

## 2023-06-23 MED ORDER — ACETAMINOPHEN 500 MG PO TABS: 1000.0000 mg | ORAL_TABLET | Freq: Three times a day (TID) | ORAL | Status: DC

## 2023-06-23 MED ORDER — HYDROMORPHONE HCL 1 MG/ML IJ SOLN
INTRAMUSCULAR | Status: AC
  Filled 2023-06-23: qty 0.5

## 2023-06-23 MED ORDER — ROCURONIUM BROMIDE 10 MG/ML (PF) SYRINGE
PREFILLED_SYRINGE | INTRAVENOUS | Status: DC | PRN
  Administered 2023-06-23: 70 mg via INTRAVENOUS
  Administered 2023-06-23: 20 mg via INTRAVENOUS
  Administered 2023-06-23: 30 mg via INTRAVENOUS

## 2023-06-23 MED ORDER — PEG-KCL-NACL-NASULF-NA ASC-C 100 G PO SOLR
0.5000 | Freq: Once | ORAL | Status: DC
  Filled 2023-06-23: qty 1

## 2023-06-23 MED ORDER — HYDROMORPHONE HCL 1 MG/ML IJ SOLN
0.5000 mg | INTRAMUSCULAR | Status: DC | PRN
  Administered 2023-06-23 – 2023-06-24 (×2): 1 mg via INTRAVENOUS
  Filled 2023-06-23 (×2): qty 1

## 2023-06-23 MED ORDER — MIDAZOLAM HCL 2 MG/2ML IJ SOLN
INTRAMUSCULAR | Status: DC | PRN
  Administered 2023-06-23: 2 mg via INTRAVENOUS

## 2023-06-23 MED ORDER — CEFAZOLIN SODIUM-DEXTROSE 2-4 GM/100ML-% IV SOLN
INTRAVENOUS | Status: AC
  Filled 2023-06-23: qty 100

## 2023-06-23 MED ORDER — CEFAZOLIN SODIUM-DEXTROSE 2-3 GM-%(50ML) IV SOLR
INTRAVENOUS | Status: DC | PRN
  Administered 2023-06-23: 2 g via INTRAVENOUS

## 2023-06-23 MED ORDER — SENNOSIDES-DOCUSATE SODIUM 8.6-50 MG PO TABS
1.0000 | ORAL_TABLET | Freq: Two times a day (BID) | ORAL | Status: DC
  Administered 2023-06-23 – 2023-06-24 (×3): 1 via ORAL
  Filled 2023-06-23 (×4): qty 1

## 2023-06-23 MED ORDER — METOCLOPRAMIDE HCL 5 MG PO TABS: 5.0000 mg | ORAL_TABLET | Freq: Three times a day (TID) | ORAL | Status: DC | PRN

## 2023-06-23 MED ORDER — FENTANYL CITRATE (PF) 250 MCG/5ML IJ SOLN
INTRAMUSCULAR | Status: AC
  Filled 2023-06-23: qty 5

## 2023-06-23 MED ORDER — FENTANYL CITRATE (PF) 100 MCG/2ML IJ SOLN
INTRAMUSCULAR | Status: AC
  Filled 2023-06-23: qty 2

## 2023-06-23 MED ORDER — PROPOFOL 10 MG/ML IV BOLUS
INTRAVENOUS | Status: AC
  Filled 2023-06-23: qty 20

## 2023-06-23 MED ORDER — SUGAMMADEX SODIUM 200 MG/2ML IV SOLN
INTRAVENOUS | Status: DC | PRN
  Administered 2023-06-23: 200 mg via INTRAVENOUS

## 2023-06-23 MED ORDER — OXYCODONE HCL 5 MG PO TABS: 5.0000 mg | ORAL_TABLET | Freq: Once | ORAL | Status: DC | PRN

## 2023-06-23 MED ORDER — POLYETHYLENE GLYCOL 3350 17 G PO PACK
17.0000 g | PACK | Freq: Every day | ORAL | Status: DC | PRN
Start: 2023-06-23 — End: 2023-06-25
  Administered 2023-06-24: 17 g via ORAL
  Filled 2023-06-23: qty 1

## 2023-06-23 MED ORDER — HYDROMORPHONE HCL 1 MG/ML IJ SOLN
INTRAMUSCULAR | Status: DC | PRN
  Administered 2023-06-23: .5 mg via INTRAVENOUS

## 2023-06-23 SURGICAL SUPPLY — 66 items
BAG COUNTER SPONGE SURGICOUNT (BAG) ×2 IMPLANT
BLADE SURG 10 STRL SS (BLADE) ×4 IMPLANT
BNDG COHESIVE 4X5 TAN STRL (GAUZE/BANDAGES/DRESSINGS) ×2 IMPLANT
BNDG ELASTIC 4INX 5YD STR LF (GAUZE/BANDAGES/DRESSINGS) IMPLANT
BNDG ELASTIC 4X5.8 VLCR STR LF (GAUZE/BANDAGES/DRESSINGS) ×2 IMPLANT
BNDG ELASTIC 6INX 5YD STR LF (GAUZE/BANDAGES/DRESSINGS) IMPLANT
BNDG ELASTIC 6X5.8 VLCR STR LF (GAUZE/BANDAGES/DRESSINGS) ×2 IMPLANT
BNDG GAUZE DERMACEA FLUFF 4 (GAUZE/BANDAGES/DRESSINGS) ×4 IMPLANT
BRUSH SCRUB EZ PLAIN DRY (MISCELLANEOUS) ×4 IMPLANT
CANISTER WOUNDNEG PRESSURE 500 (CANNISTER) IMPLANT
CHLORAPREP W/TINT 26 (MISCELLANEOUS) ×2 IMPLANT
COVER SURGICAL LIGHT HANDLE (MISCELLANEOUS) ×4 IMPLANT
DRAPE C-ARM 42X72 X-RAY (DRAPES) ×2 IMPLANT
DRAPE C-ARMOR (DRAPES) ×2 IMPLANT
DRAPE HALF SHEET 40X57 (DRAPES) ×4 IMPLANT
DRAPE IMP U-DRAPE 54X76 (DRAPES) ×4 IMPLANT
DRAPE INCISE IOBAN 66X45 STRL (DRAPES) IMPLANT
DRAPE SURG ORHT 6 SPLT 77X108 (DRAPES) ×4 IMPLANT
DRAPE U-SHAPE 47X51 STRL (DRAPES) ×2 IMPLANT
DRESSING MEPILEX FLEX 4X4 (GAUZE/BANDAGES/DRESSINGS) IMPLANT
DRESSING PEEL AND PLAC PRVNA20 (GAUZE/BANDAGES/DRESSINGS) IMPLANT
DRILL DISTAL 4.3 INTERLOCK (DRILL) IMPLANT
DRILL SHORT 4.3 INTERLOCK (DRILL) IMPLANT
DRSG ADAPTIC 3X8 NADH LF (GAUZE/BANDAGES/DRESSINGS) ×2 IMPLANT
DRSG MEPILEX FLEX 4X4 (GAUZE/BANDAGES/DRESSINGS) ×6
DRSG MEPILEX POST OP 4X8 (GAUZE/BANDAGES/DRESSINGS) IMPLANT
DRSG PEEL AND PLACE PREVENA 20 (GAUZE/BANDAGES/DRESSINGS) ×2
ELECT REM PT RETURN 9FT ADLT (ELECTROSURGICAL) ×2
ELECTRODE REM PT RTRN 9FT ADLT (ELECTROSURGICAL) ×2 IMPLANT
GAUZE SPONGE 4X4 12PLY STRL (GAUZE/BANDAGES/DRESSINGS) ×2 IMPLANT
GLOVE BIO SURGEON STRL SZ 6.5 (GLOVE) ×6 IMPLANT
GLOVE BIO SURGEON STRL SZ7.5 (GLOVE) ×8 IMPLANT
GLOVE BIOGEL PI IND STRL 6.5 (GLOVE) ×2 IMPLANT
GLOVE BIOGEL PI IND STRL 7.5 (GLOVE) ×2 IMPLANT
GOWN STRL REUS W/ TWL LRG LVL3 (GOWN DISPOSABLE) ×4 IMPLANT
GUIDEPIN 3.2X17.5 THRD DISP (PIN) IMPLANT
GUIDEWIRE BEAD TIP 100 ST (WIRE) IMPLANT
KIT BASIN OR (CUSTOM PROCEDURE TRAY) ×2 IMPLANT
KIT DRSG PREVENA PLUS 7DAY 125 (MISCELLANEOUS) IMPLANT
KIT TURNOVER KIT B (KITS) ×2 IMPLANT
MANIFOLD NEPTUNE II (INSTRUMENTS) ×2 IMPLANT
NAIL TIB AFFIX ST 9X380 (Nail) IMPLANT
NS IRRIG 1000ML POUR BTL (IV SOLUTION) ×2 IMPLANT
PACK ORTHO EXTREMITY (CUSTOM PROCEDURE TRAY) IMPLANT
PACK TOTAL JOINT (CUSTOM PROCEDURE TRAY) ×2 IMPLANT
PAD ARMBOARD 7.5X6 YLW CONV (MISCELLANEOUS) ×4 IMPLANT
PAD CAST 4YDX4 CTTN HI CHSV (CAST SUPPLIES) IMPLANT
PADDING CAST COTTON 6X4 STRL (CAST SUPPLIES) ×6 IMPLANT
SCREW CORT AFFIX ST 5X34 (Screw) IMPLANT
SCREW CORT AFFIX ST 5X38 (Screw) IMPLANT
SCREW CORT AFFIX ST 5X70 (Screw) IMPLANT
SCREW CORT ST AFFIX 5X46 (Screw) IMPLANT
SCREW CORTICAL BONE 5X42 ST (Screw) IMPLANT
SPONGE T-LAP 18X18 ~~LOC~~+RFID (SPONGE) ×2 IMPLANT
STAPLER VISISTAT 35W (STAPLE) ×2 IMPLANT
SUT ETHILON 2 0 FS 18 (SUTURE) IMPLANT
SUT ETHILON 3 0 PS 1 (SUTURE) IMPLANT
SUT MNCRL AB 3-0 PS2 18 (SUTURE) ×2 IMPLANT
SUT MON AB 2-0 CT1 36 (SUTURE) ×2 IMPLANT
SUT VIC AB 0 CT1 27XBRD ANBCTR (SUTURE) IMPLANT
SUT VIC AB 2-0 CT1 TAPERPNT 27 (SUTURE) IMPLANT
TOWEL GREEN STERILE (TOWEL DISPOSABLE) ×4 IMPLANT
TOWEL GREEN STERILE FF (TOWEL DISPOSABLE) ×4 IMPLANT
UNDERPAD 30X36 HEAVY ABSORB (UNDERPADS AND DIAPERS) ×2 IMPLANT
WATER STERILE IRR 1000ML POUR (IV SOLUTION) ×4 IMPLANT
YANKAUER SUCT BULB TIP NO VENT (SUCTIONS) IMPLANT

## 2023-06-23 NOTE — Op Note (Signed)
Orthopaedic Surgery Operative Note (CSN: [id number] ) Date of Surgery: 06/23/2023  Admit Date: 06/22/2023   Diagnoses: Pre-Op Diagnoses: Right type IIIA/B open segmental tibial shaft fracture  Post-Op Diagnosis: Same  Procedures: CPT 27759-Intramedullary nailing of right segmental tibia fracture CPT 11012-Irrigation and debridement of right open proximal tibia CPT 20694-Removal of external fixation right leg CPT 97605-Incisional wound vac placement right leg  Surgeons : Primary: Roby Lofts, MD  Assistant: Ulyses Southward, PA-C  Location: OR 3   Anesthesia: General   Antibiotics: Ancef 2g preop with 1 gm vancomycin powder and 1.2 tobramycin powder placed topically   Tourniquet time: None    Estimated Blood Loss: 300 mL  Complications:* No complications entered in OR log *   Specimens:* No specimens in log *   Implants: Implant Name Type Inv. Item Serial No. Manufacturer Lot No. LRB No. Used Action  Affixus 21mmx380mm Tibial Nail Nail   ZIMMER [id number] Right 1 Implanted  SCREW CORT AFFIX ST 5X70 - [id number] Screw SCREW CORT AFFIX ST 5X70  ZIMMER RECON(ORTH,TRAU,BIO,SG) [id number] Right 1 Implanted  SCREW CORT ST AFFIX 5X46 - [id number] Screw SCREW CORT ST AFFIX 5X46  ZIMMER RECON(ORTH,TRAU,BIO,SG) [id number] Right 1 Implanted  SCREW CORT AFFIX ST 5X38 - [id number] Screw SCREW CORT AFFIX ST 5X38  ZIMMER RECON(ORTH,TRAU,BIO,SG) [id number] Right 1 Implanted  SCREW CORT AFFIX ST 5X34 - [id number] Screw SCREW CORT AFFIX ST 5X34  ZIMMER RECON(ORTH,TRAU,BIO,SG) [id number] Right 1 Implanted  SCREW CORTICAL BONE 5X42 ST - [id number] Screw SCREW CORTICAL BONE 5X42 ST  ZIMMER RECON(ORTH,TRAU,BIO,SG) [id number] Right 1 Implanted     Indications for Surgery: 43 year old male who sustained a right open segmental tibial shaft fracture.  He was taken urgently for irrigation debridement with external fixation of his right lower extremity.  Due to the contamination of his injury and the complex  nature of his injury I recommend proceeding with repeat irrigation debridement with possible intramedullary nailing versus open reduction internal fixation of his tibia.  Risks and benefits were discussed with the patient and his significant other.  Risks included but not limited to bleeding, infection, malunion, nonunion, hardware failure, hardware rotation, nerve and blood vessel injury, need for soft tissue coverage, DVT, knee stiffness, even the possibility anesthetic complications.  He agreed to proceed with surgery and consent was obtained.  Operative Findings: 1.  Repeat irrigation debridement of right type IIIa/B open proximal tibia fracture with segmental nature to the injury. 2.  Open reduction of the proximal tibia using reduction tenaculums and intramedullary nailing using [employer] Affixus 9 x 380 mm nail. 3.  Removal of external fixation from right lower extremity 4.  Primary closure of traumatic laceration measuring approximately 22 cm with placement of incisional wound VAC.  Procedure: The patient was identified in the preoperative holding area. Consent was confirmed with the patient and their family and all questions were answered. The operative extremity was marked after confirmation with the patient. he was then brought back to the operating room by our anesthesia colleagues.  He was placed under general anesthetic and carefully transferred over to radiolucent flattop table.  A bump was placed under his operative hip.  The right lower extremity was then prepped and draped in usual sterile fashion.  I prepped the external fixator into the field to make sure that if I needed to delay or due to a provisional debridement that I would be able to maintain external fixator.  Timeout was performed to verify the patient, the procedure, and the extremity.  Preoperative  antibiotics were dosed.  External fixation was removed and reopened the traumatic laceration that was over the anterior and  medial aspect of the proximal tibia fracture.  I delivered the bone ends through the skin and was able to remove any remaining contamination.  There was very little.  I was able to curette the edge of the bone and deliver these through the wound to visualize the entirety of the fracture.  I then used low-pressure pulsatile lavage to thoroughly irrigate the bone ends in the wound with 3 L of normal saline.  Gloves and instruments were then changed and I turned my attention to fixation of the tibia.  I reduced the proximal tibial fracture using reduction tenaculums and obtain anatomic reduction confirmed this with fluoroscopic imaging.  A lateral parapatellar incision was then made and carried down through skin and subcutaneous tissue.  I incised through the lateral retinaculum and mobilized the patella medially.  I then directed a threaded guidewire at the appropriate starting point and advanced it into the proximal metaphysis.  I then used a awl to enter the medullary canal.  I then passed a ball-tipped guidewire down the center of the canal crossing the proximal fracture and also crossing the distal 2 fracture sites.  I was able to see that into the distal metaphysis.  I then measured the length and chose to use a 380 mm nail.  I then sequentially reamed from 8 mm to 10.5 mm and obtained excellent chatter.  I then decided to place a 9 mm nail.  The 9 mm nail was attached to the targeting arm and placed on the center of the canal.  The fracture aligned appropriately.  I then used the targeting arm to place 3 proximal interlocking screws.  I then placed 2 distal interlocking screws using perfect circle technique from medial to lateral.  I then removed my clamps and there was no motion at the fracture site so no further fixation was warranted at the proximal segment.  The targeting arm was removed and final fluoroscopic imaging was obtained.  The incisions were copiously irrigated.  A gram of vancomycin powder 1.2  g tobramycin powder were placed into the traumatic laceration.  I then performed a layered closure of the traumatic laceration using 2-0 nylon, 2-0 Monocryl and 3-0 nylon.  The lateral parapatellar incision was closed with 0 Vicryl, 2-0 Monocryl and 3-0 nylon.  An incisional wound VAC was placed over the traumatic laceration sterile dressings were placed on the remainder of the incisions.  The patient was awoke from anesthesia and taken to the PACU in stable condition.   Debridement type: Excisional Debridement  Side: right  Body Location: Tibia  Tools used for debridement: scalpel, scissors, curette, and rongeur  Pre-debridement Wound size (cm):   Length: 22        Width: 8     Depth: 2   Post-debridement Wound size (cm):   N/A-closed  Debridement depth beyond dead/damaged tissue down to healthy viable tissue: yes  Tissue layer involved: skin, subcutaneous tissue, muscle / fascia, bone  Nature of tissue removed: Devitalized Tissue and Non-viable tissue  Irrigation volume: 3L     Irrigation fluid type: Normal Saline  Post Op Plan/Instructions: The patient will be nonweightbearing to the right lower extremity.  He will receive ceftriaxone for open fracture prophylaxis.  Will have him mobilize with physical and Occupational Therapy.  He will receive Lovenox for DVT prophylaxis and discharged on aspirin 81 mg.  I was present  and performed the entire surgery.  Ulyses Southward, PA-C did assist me throughout the case. An assistant was necessary given the difficulty in approach, maintenance of reduction and ability to instrument the fracture.   Truitt Merle, MD [facility]

## 2023-06-23 NOTE — Anesthesia Procedure Notes (Signed)
Procedure Name: Intubation Date/Time: 06/23/2023 10:38 AM  Performed by: Sandie Ano, CRNAPre-anesthesia Checklist: Patient identified, Emergency Drugs available, Suction available and Patient being monitored Patient Re-evaluated:Patient Re-evaluated prior to induction Oxygen Delivery Method: Circle System Utilized Preoxygenation: Pre-oxygenation with 100% oxygen Induction Type: IV induction Ventilation: Mask ventilation without difficulty Laryngoscope Size: Mac and 3 Grade View: Grade I Tube type: Oral Tube size: 7.5 mm Number of attempts: 1 Airway Equipment and Method: Stylet and Oral airway Placement Confirmation: ETT inserted through vocal cords under direct vision, positive ETCO2 and breath sounds checked- equal and bilateral Secured at: 21 cm Tube secured with: Tape Dental Injury: Teeth and Oropharynx as per pre-operative assessment

## 2023-06-23 NOTE — Anesthesia Postprocedure Evaluation (Signed)
Anesthesia Post Note  Patient: Rod Holler  Procedure(s) Performed: EXTERNAL FIXATION LEG (Right: Leg Lower) IRRIGATION AND DEBRIDEMENT RIGHT LOWER LEG (Right)     Patient location during evaluation: PACU Anesthesia Type: General Level of consciousness: awake Pain management: pain level controlled Vital Signs Assessment: post-procedure vital signs reviewed and stable Respiratory status: spontaneous breathing, nonlabored ventilation and respiratory function stable Cardiovascular status: blood pressure returned to baseline and stable Postop Assessment: no apparent nausea or vomiting Anesthetic complications: no   No notable events documented.  Last Vitals:  Vitals:   06/23/23 0102 06/23/23 0307  BP: 110/79   Pulse: 89   Resp: 18 20  Temp: 37.4 C 36.9 C  SpO2: 98%     Last Pain:  Vitals:   06/23/23 0622  TempSrc:   PainSc: 4                  Chenay Nesmith P Stokes Rattigan

## 2023-06-23 NOTE — Anesthesia Preprocedure Evaluation (Signed)
Anesthesia Evaluation  Patient identified by MRN, date of birth, ID band Patient awake    Reviewed: Allergy & Precautions, NPO status , Patient's Chart, lab work & pertinent test results  Airway Mallampati: II  TM Distance: >3 FB     Dental  (+) Teeth Intact, Dental Advisory Given   Pulmonary Current Smoker and Patient abstained from smoking.   Pulmonary exam normal breath sounds clear to auscultation       Cardiovascular hypertension, Pt. on medications Normal cardiovascular exam Rhythm:Regular Rate:Normal     Neuro/Psych negative neurological ROS  negative psych ROS   GI/Hepatic negative GI ROS,,,(+)     substance abuse  alcohol use  Endo/Other  negative endocrine ROS    Renal/GU Renal InsufficiencyRenal disease  negative genitourinary   Musculoskeletal Open Fx right tibia S/P External fixation yesterday Multiple abrasions right arm   Abdominal  (+) + obese  Peds  Hematology  (+) Blood dyscrasia, anemia   Anesthesia Other Findings   Reproductive/Obstetrics HSV                              Anesthesia Physical Anesthesia Plan  ASA: 2  Anesthesia Plan: General   Post-op Pain Management: Dilaudid IV, Precedex and Ketamine IV*   Induction: Intravenous  PONV Risk Score and Plan: 2 and Ondansetron, Dexamethasone, Midazolam and Treatment may vary due to age or medical condition  Airway Management Planned: Oral ETT  Additional Equipment: None  Intra-op Plan:   Post-operative Plan: Extubation in OR  Informed Consent: I have reviewed the patients History and Physical, chart, labs and discussed the procedure including the risks, benefits and alternatives for the proposed anesthesia with the patient or authorized representative who has indicated his/her understanding and acceptance.     Dental advisory given  Plan Discussed with: CRNA and Anesthesiologist  Anesthesia Plan  Comments:         Anesthesia Quick Evaluation
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
## 2023-06-23 NOTE — Interval H&P Note (Signed)
History and Physical Interval Note:  06/23/2023 9:26 AM  Alexander Stone  has presented today for surgery, with the diagnosis of Right open tibia fracture.  The various methods of treatment have been discussed with the patient and family. After consideration of risks, benefits and other options for treatment, the patient has consented to  Procedure(s): INTRAMEDULLARY (IM) NAIL TIBIAL (Right) REMOVAL EXTERNAL FIXATION LEG (Right) as a surgical intervention.  The patient's history has been reviewed, patient examined, no change in status, stable for surgery.  I have reviewed the patient's chart and labs.  Questions were answered to the patient's satisfaction.     Caryn Bee P Chandlar Guice

## 2023-06-23 NOTE — Anesthesia Postprocedure Evaluation (Signed)
Anesthesia Post Note  Patient: Alexander Stone  Procedure(s) Performed: INTRAMEDULLARY (IM) NAIL TIBIAL (Right) REMOVAL EXTERNAL FIXATION LEG (Right) IRRIGATION AND DEBRIDEMENT EXTREMITY (Right: Leg Lower) APPLICATION OF WOUND VAC (Right: Leg Lower)     Patient location during evaluation: PACU Anesthesia Type: General Level of consciousness: awake and alert and oriented Pain management: pain level controlled Vital Signs Assessment: post-procedure vital signs reviewed and stable Respiratory status: spontaneous breathing, nonlabored ventilation and respiratory function stable Cardiovascular status: blood pressure returned to baseline and stable Postop Assessment: no apparent nausea or vomiting Anesthetic complications: no   No notable events documented.  Last Vitals:  Vitals:   06/23/23 1300 06/23/23 1315  BP: (!) 155/88 (!) 145/94  Pulse: 78 75  Resp: 19 13  Temp:    SpO2: 93% 97%    Last Pain:  Vitals:   06/23/23 1315  TempSrc:   PainSc: Asleep                 Roshan Salamon A.

## 2023-06-23 NOTE — Progress Notes (Signed)
Orthopedic Note  No acute events since surgery. Patient has now moved from the PACU to a floor room. Came by the patient's room just now to perform a compartment check. Patient was sleeping comfortably. Wound vac was in place with good seal. Dressings c/d/I. Will come back in a few hours to check on him again.   London Sheer, MD Orthopedic Surgeon

## 2023-06-23 NOTE — Plan of Care (Signed)
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
## 2023-06-23 NOTE — Plan of Care (Signed)

## 2023-06-23 NOTE — TOC CAGE-AID Note (Signed)
Transition of Care [facility]) - CAGE-AID Screening  Patient Details  Name: Alexander Stone MRN: [id number] Date of Birth: [birth year]  Clinical Narrative:  Patient endorses alcohol use, states he takes 3-4 shots every other day. Patient was asked about withdrawal symptoms and states he was recently released from prison and has never had withdrawal symptoms. He describes his alcohol use as minimal and does not feel he needs to cut down at this time. Patient denies any illicit drug use. Does endorse smoking approximately a ppd of cigarettes. Spoke to RN who will request a nicotine patch if appropriate. Patient denies need for substance abuse resources.  CAGE-AID Screening:   Have You Ever Felt You Ought to Cut Down on Your Drinking or Drug Use?: No Have People Annoyed You By Critizing Your Drinking Or Drug Use?: No Have You Felt Bad Or Guilty About Your Drinking Or Drug Use?: No Have You Ever Had a Drink or Used Drugs First Thing In The Morning to Steady Your Nerves or to Get Rid of a Hangover?: No CAGE-AID Score: 0  Substance Abuse Education Offered: Yes

## 2023-06-23 NOTE — Transfer of Care (Signed)
Immediate Anesthesia Transfer of Care Note  Patient: Alexander Stone  Procedure(s) Performed: INTRAMEDULLARY (IM) NAIL TIBIAL (Right) REMOVAL EXTERNAL FIXATION LEG (Right) IRRIGATION AND DEBRIDEMENT EXTREMITY (Right: Leg Lower) APPLICATION OF WOUND VAC (Right: Leg Lower)  Patient Location: PACU  Anesthesia Type:General  Level of Consciousness: awake, alert , and oriented  Airway & Oxygen Therapy: Patient Spontanous Breathing  Post-op Assessment: Report given to RN and Post -op Vital signs reviewed and stable  Post vital signs: Reviewed and stable  Last Vitals:  Vitals Value Taken Time  BP 147/92 06/23/23 1246  Temp 37.1 C 06/23/23 1246  Pulse 79 06/23/23 1251  Resp 21 06/23/23 1251  SpO2 94 % 06/23/23 1251  Vitals shown include unfiled device data.  Last Pain:  Vitals:   06/23/23 1246  TempSrc:   PainSc: Asleep      Patients Stated Pain Goal: 2 (06/23/23 0552)  Complications: No notable events documented.

## 2023-06-23 NOTE — Progress Notes (Signed)
PT Cancellation Note  Patient Details Name: Alexander Stone MRN: [id number] DOB: 02-20-1981   Cancelled Treatment:    Reason Eval/Treat Not Completed: Patient at procedure or test/unavailable; currently in OR, will attempt as able and pt able to participate.    [name] 06/23/2023, 11:34 AM Sheran Lawless, PT Acute Rehabilitation Services [phone] 06/23/2023

## 2023-06-23 NOTE — Progress Notes (Addendum)
Orthopedic Surgery Progress Note   Assessment: Patient is a 43 y.o. male with right grade 3 open segmental tibia status post I&D and ex-fix   Plan: -Will need repeat debridement and definitive fixation. Coordinating on timing with my traumatology colleagues -Keep NPO for now -DVT ppx: lovenox -Antibiotics: ceftriaxone -Weight bearing status: NWB RLE -PT evaluate and treat -Pain control -Dispo: pending completion of operative plans  ___________________________________________________________________________  Subjective: No acute events overnight. Was able to get some sleep last night. Having pain in his leg this morning.   Per chart review, he has only gotten 0.5mg  of dilaudid on the floor and has not gotten any of the oxycodone.   Physical Exam:  General: no acute distress, appears stated age Neurologic: alert, answering questions appropriately, following commands Respiratory: unlabored breathing on room air, symmetric chest rise Psychiatric: appropriate affect, normal cadence to speech  MSK:   -Right lower extremity  Ex-fix in place. Wound vac with good seal and no evidence of leak  Compartments compressible, no increased pain with active or passive stretch at the ankle  Actively ranges his ankle EHL/TA/GSC intact Plantarflexes and dorsiflexes toes Sensation intact to light touch in sural, saphenous, tibial, deep peroneal, and superficial peroneal nerve distributions Foot warm and well perfused, palpable DP pulse   Patient name: Alexander Stone Patient MRN: [id number] Date: 06/23/23

## 2023-06-24 ENCOUNTER — Encounter (HOSPITAL_COMMUNITY): Payer: Self-pay | Admitting: Student

## 2023-06-24 LAB — CBC
HCT: 27.6 % — ABNORMAL LOW (ref 39.0–52.0)
Hemoglobin: 9 g/dL — ABNORMAL LOW (ref 13.0–17.0)
MCH: 28.4 pg (ref 26.0–34.0)
MCHC: 32.6 g/dL (ref 30.0–36.0)
MCV: 87.1 fL (ref 80.0–100.0)
Platelets: 189 10*3/uL (ref 150–400)
RBC: 3.17 MIL/uL — ABNORMAL LOW (ref 4.22–5.81)
RDW: 14.9 % (ref 11.5–15.5)
WBC: 8.2 10*3/uL (ref 4.0–10.5)
nRBC: 0 % (ref 0.0–0.2)

## 2023-06-24 LAB — BASIC METABOLIC PANEL
Anion gap: 6 (ref 5–15)
BUN: 21 mg/dL — ABNORMAL HIGH (ref 6–20)
CO2: 28 mmol/L (ref 22–32)
Calcium: 8.3 mg/dL — ABNORMAL LOW (ref 8.9–10.3)
Chloride: 104 mmol/L (ref 98–111)
Creatinine, Ser: 1.82 mg/dL — ABNORMAL HIGH (ref 0.61–1.24)
GFR, Estimated: 47 mL/min — ABNORMAL LOW (ref >60)
Glucose, Bld: 139 mg/dL — ABNORMAL HIGH (ref 70–99)
Potassium: 3.9 mmol/L (ref 3.5–5.1)
Sodium: 138 mmol/L (ref 135–145)

## 2023-06-24 MED ORDER — SODIUM CHLORIDE 0.9 % IV SOLN: INTRAVENOUS | Status: AC

## 2023-06-24 MED ORDER — VITAMIN D 25 MCG (1000 UNIT) PO TABS
2000.0000 [IU] | ORAL_TABLET | Freq: Every day | ORAL | Status: DC
  Administered 2023-06-24 – 2023-06-25 (×2): 2000 [IU] via ORAL
  Filled 2023-06-24 (×2): qty 2

## 2023-06-24 MED ORDER — ENOXAPARIN SODIUM 40 MG/0.4ML IJ SOSY: 40.0000 mg | PREFILLED_SYRINGE | INTRAMUSCULAR | Status: DC

## 2023-06-24 NOTE — Progress Notes (Signed)
Orthopaedic Trauma Progress Note  SUBJECTIVE: Doing ok. Pain controlled. Was able to mobilize with PT post op yesterday. No chest pain. No SOB. No nausea/vomiting. No other complaints. Passing gas. No BM since admission. Denies any other retained foreign bodies in his rectum.  Serum creatinine elevated this morning with worsening GFR.  Start on IV fluids  OBJECTIVE:  Vitals:   06/24/23 0600 06/24/23 0758  BP: (!) 149/83 (!) 141/84  Pulse:  94  Resp:  19  Temp: 98.4 F (36.9 C) 98.3 F (36.8 C)  SpO2:  95%    General: Sitting up in bed, NAD Respiratory: No increased work of breathing.  RLE: Dressing CDI. Incisional vac with good seal and function, ~25 mL in canister. Ankle DF/PF intact. Calf swollen but compartments compressible. Wiggles toes. +DP pulse  IMAGING: Stable post op imaging.   LABS:  Results for orders placed or performed during the hospital encounter of 06/22/23 (from the past 24 hours)  VITAMIN D 25 Hydroxy (Vit-D Deficiency, Fractures)     Status: Abnormal   Collection Time: 06/23/23  3:01 PM  Result Value Ref Range   Vit D, 25-Hydroxy 20.76 (L) 30 - 100 ng/mL  Basic metabolic panel     Status: Abnormal   Collection Time: 06/24/23  5:12 AM  Result Value Ref Range   Sodium 138 135 - 145 mmol/L   Potassium 3.9 3.5 - 5.1 mmol/L   Chloride 104 98 - 111 mmol/L   CO2 28 22 - 32 mmol/L   Glucose, Bld 139 (H) 70 - 99 mg/dL   BUN 21 (H) 6 - 20 mg/dL   Creatinine, Ser 5.78 (H) 0.61 - 1.24 mg/dL   Calcium 8.3 (L) 8.9 - 10.3 mg/dL   GFR, Estimated 47 (L) >60 mL/min   Anion gap 6 5 - 15  CBC     Status: Abnormal   Collection Time: 06/24/23  5:12 AM  Result Value Ref Range   WBC 8.2 4.0 - 10.5 K/uL   RBC 3.17 (L) 4.22 - 5.81 MIL/uL   Hemoglobin 9.0 (L) 13.0 - 17.0 g/dL   HCT 46.9 (L) 62.9 - 52.8 %   MCV 87.1 80.0 - 100.0 fL   MCH 28.4 26.0 - 34.0 pg   MCHC 32.6 30.0 - 36.0 g/dL   RDW 41.3 24.4 - 01.0 %   Platelets 189 150 - 400 K/uL   nRBC 0.0 0.0 - 0.2 %     ASSESSMENT: Alexander Stone is a 43 y.o. male, 1 Day Post-Op s/p INTRAMEDULLARY NAIL RIGHT TIBIA REMOVAL EXTERNAL FIXATION RIGHT LEG IRRIGATION AND DEBRIDEMENT EXTREMITY APPLICATION OF WOUND VAC  CV/Blood loss: Acute blood loss anemia, Hgb 9.0 this AM. Hemodynamically stable  PLAN: Weightbearing: NWB RLE ROM:  Ok for knee ROM  Incisional and dressing care: Reinforce dressings as needed  Showering:  Keep leg dry/covered when showering Orthopedic device(s): Wound Vac:RLE   Pain management:  1. Tylenol 1000 mg q 6 hours scheduled 2. Robaxin 500 mg q 6 hours PRN 3. Oxycodone 5-15 mg q 4 hours PRN 4. Dilaudid 0.5-1 mg q 4 hours PRN VTE prophylaxis:  Hold Lovenox , SCDs ID: Ceftriaxone post op per open fracture protocol Foley/Lines:  No foley, KVO IVFs Impediments to Fracture Healing: Open fx. Vit D level 20, start supplementation Dispo: PT/OT evaluation ongoing.  Plan for discharge home tomorrow 06/25/2023 if pain controlled and mobilizing well.  Will transition patient to Prevena wound VAC at discharge  D/C recommendations: -Oxycodone and Robaxin for pain control -  Aspirin 325 mg daily x 30 days for DVT prophylaxis -Continue 2000 units Vit D supplementation daily x 90 days  Follow - up plan: 1 week for removal of wound VAC and repeat x-rays   Contact information:  Truitt Merle MD, Thyra Breed PA-C. After hours and holidays please check [email or web address] for group call information for [facility]   Thompson Caul, PA-C [phone] (office) [email or web address]

## 2023-06-24 NOTE — Progress Notes (Addendum)
    Referring Provider: Dr. Willia Craze Primary Care Physician:  No primary care provider on file. Primary Gastroenterologist:  Gentry Fitz    Reason for Consultation: Foreign object in rectum  Brief GI Note: Refer to GI consult note 06/23/2023.  Patient stated he spontaneously passed 1 bag of acyclovir per the rectum 06/22/2023 and the second bag was removed by general surgery.  He is adamant that there are no further bags of pills in his rectum.  He declined flexible sigmoidoscopy.  Abdominal x-ray yesterday evening showed the following: 1. The filling defects identified within the rectum on CT from 12/19/2022 do not appear calcified or metallic (in retrospect) and are unlikely to be seen on plain film radiographs. Additionally, there is contrast material within the urinary bladder which overlies the rectum. For these reasons, this exam is unlikely to be of significant diagnostic value in assessing for retained foreign bodies. 2. Mild gaseous distension of the colon may reflect postoperative ileus.   Patient was seen by Ortho surgery today, patient passing gas per the rectum, no BM.  Started on a regular diet.  Our GI service signing off at this time, call with any questions or concerns  Alcide Evener, NP    My additional thoughts are as follows:  Ortho/trauma PA informed us by chat message that this patient continues to state he passed or removed the second pill bag and he declines the sigmoidoscopy.   Charlie Pitter III Office:[phone]

## 2023-06-24 NOTE — Progress Notes (Signed)
Orthopedic Tech Progress Note Patient Details:  Alexander Stone 10-19-1980 [id number]  Ortho Devices Type of Ortho Device: Crutches Ortho Device/Splint Interventions: Ordered, Application, Adjustment   Post Interventions Patient Tolerated: Ambulated well, Well Instructions Provided: Care of device  [name] 06/24/2023, 5:23 PM
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
## 2023-06-25 ENCOUNTER — Other Ambulatory Visit (HOSPITAL_COMMUNITY): Payer: Self-pay

## 2023-06-25 LAB — CBC
HCT: 25.5 % — ABNORMAL LOW (ref 39.0–52.0)
Hemoglobin: 8.1 g/dL — ABNORMAL LOW (ref 13.0–17.0)
MCH: 28.3 pg (ref 26.0–34.0)
MCHC: 31.8 g/dL (ref 30.0–36.0)
MCV: 89.2 fL (ref 80.0–100.0)
Platelets: 204 10*3/uL (ref 150–400)
RBC: 2.86 MIL/uL — ABNORMAL LOW (ref 4.22–5.81)
RDW: 15.2 % (ref 11.5–15.5)
WBC: 8.3 10*3/uL (ref 4.0–10.5)
nRBC: 0 % (ref 0.0–0.2)

## 2023-06-25 LAB — BASIC METABOLIC PANEL
Anion gap: 8 (ref 5–15)
BUN: 12 mg/dL (ref 6–20)
CO2: 26 mmol/L (ref 22–32)
Calcium: 8.3 mg/dL — ABNORMAL LOW (ref 8.9–10.3)
Chloride: 104 mmol/L (ref 98–111)
Creatinine, Ser: 1.82 mg/dL — ABNORMAL HIGH (ref 0.61–1.24)
GFR, Estimated: 47 mL/min — ABNORMAL LOW (ref >60)
Glucose, Bld: 112 mg/dL — ABNORMAL HIGH (ref 70–99)
Potassium: 3.8 mmol/L (ref 3.5–5.1)
Sodium: 138 mmol/L (ref 135–145)

## 2023-06-25 LAB — HEMOGLOBIN AND HEMATOCRIT, BLOOD
HCT: 25.5 % — ABNORMAL LOW (ref 39.0–52.0)
Hemoglobin: 8 g/dL — ABNORMAL LOW (ref 13.0–17.0)

## 2023-06-25 MED ORDER — ASPIRIN 325 MG PO TBEC
325.0000 mg | DELAYED_RELEASE_TABLET | Freq: Every day | ORAL | 0 refills | Status: AC
  Filled 2023-06-25: qty 30, 30d supply, fill #0

## 2023-06-25 MED ORDER — SORBITOL 70 % SOLN
30.0000 mL | Freq: Once | Status: DC
  Filled 2023-06-25: qty 30

## 2023-06-25 MED ORDER — FERROUS SULFATE 325 (65 FE) MG PO TABS
325.0000 mg | ORAL_TABLET | Freq: Two times a day (BID) | ORAL | 0 refills | Status: AC
  Filled 2023-06-25: qty 60, 30d supply, fill #0

## 2023-06-25 MED ORDER — FERROUS SULFATE 325 (65 FE) MG PO TABS: 325.0000 mg | ORAL_TABLET | Freq: Two times a day (BID) | ORAL | Status: DC

## 2023-06-25 MED ORDER — OXYCODONE HCL 10 MG PO TABS
5.0000 mg | ORAL_TABLET | ORAL | 0 refills | Status: DC | PRN
  Filled 2023-06-25: qty 42, 7d supply, fill #0

## 2023-06-25 MED ORDER — ACETAMINOPHEN 500 MG PO TABS: 1000.0000 mg | ORAL_TABLET | Freq: Four times a day (QID) | ORAL | Status: AC | PRN

## 2023-06-25 MED ORDER — METHOCARBAMOL 500 MG PO TABS
500.0000 mg | ORAL_TABLET | Freq: Four times a day (QID) | ORAL | 0 refills | Status: AC | PRN
  Filled 2023-06-25: qty 28, 7d supply, fill #0

## 2023-06-25 MED ORDER — POLYETHYLENE GLYCOL 3350 17 G PO PACK
17.0000 g | PACK | Freq: Every day | ORAL | Status: DC
  Filled 2023-06-25: qty 1

## 2023-06-25 MED ORDER — VITAMIN D-3 125 MCG (5000 UT) PO TABS
1.0000 | ORAL_TABLET | Freq: Every day | ORAL | 0 refills | Status: AC
  Filled 2023-06-25: qty 90, 90d supply, fill #0
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
## 2023-06-25 NOTE — Progress Notes (Signed)
Orthopaedic Trauma Progress Note  SUBJECTIVE: Doing well. Pain controlled.  No chest pain. No SOB. No nausea/vomiting. No other complaints. Passing gas. No BM since admission.  Serum creatinine elevated this morning with worsening GFR.  Start on IV fluids  OBJECTIVE:  Vitals:   06/24/23 1926 06/25/23 0355  BP: (!) 141/86 (!) 141/84  Pulse:  86  Resp: 18   Temp: 98.5 F (36.9 C) 97.8 F (36.6 C)  SpO2: 98% 97%    General: Sitting up in bed, NAD Respiratory: No increased work of breathing.  RLE: Dressings changed, incisions CDI. Incisional vac with good seal and function over traumatic laceration. Wound vac left in place. Ankle DF/PF intact. Calf swollen but compartments compressible. Wiggles toes. +DP pulse  IMAGING: Stable post op imaging.   LABS:  Results for orders placed or performed during the hospital encounter of 06/22/23 (from the past 24 hours)  CBC     Status: Abnormal   Collection Time: 06/25/23  5:50 AM  Result Value Ref Range   WBC 8.3 4.0 - 10.5 K/uL   RBC 2.86 (L) 4.22 - 5.81 MIL/uL   Hemoglobin 8.1 (L) 13.0 - 17.0 g/dL   HCT 13.0 (L) 86.5 - 78.4 %   MCV 89.2 80.0 - 100.0 fL   MCH 28.3 26.0 - 34.0 pg   MCHC 31.8 30.0 - 36.0 g/dL   RDW 69.6 29.5 - 28.4 %   Platelets 204 150 - 400 K/uL   nRBC 0.0 0.0 - 0.2 %  Basic metabolic panel     Status: Abnormal   Collection Time: 06/25/23  5:50 AM  Result Value Ref Range   Sodium 138 135 - 145 mmol/L   Potassium 3.8 3.5 - 5.1 mmol/L   Chloride 104 98 - 111 mmol/L   CO2 26 22 - 32 mmol/L   Glucose, Bld 112 (H) 70 - 99 mg/dL   BUN 12 6 - 20 mg/dL   Creatinine, Ser 1.32 (H) 0.61 - 1.24 mg/dL   Calcium 8.3 (L) 8.9 - 10.3 mg/dL   GFR, Estimated 47 (L) >60 mL/min   Anion gap 8 5 - 15    ASSESSMENT: Alexander Stone is a 43 y.o. male, 2 Days Post-Op s/p INTRAMEDULLARY NAIL RIGHT TIBIA REMOVAL EXTERNAL FIXATION RIGHT LEG IRRIGATION AND DEBRIDEMENT EXTREMITY APPLICATION OF WOUND VAC  CV/Blood loss: Acute blood loss  anemia, Hgb 8.1 this AM. Hypertensive but stable  PLAN: Weightbearing: NWB RLE ROM:  Ok for knee ROM  Incisional and dressing care: Leave dressing in place until f/u Showering:  Keep leg dry/covered when showering Orthopedic device(s): Wound Vac:RLE   Pain management:  1. Tylenol 1000 mg q 6 hours scheduled 2. Robaxin 500 mg q 6 hours PRN 3. Oxycodone 5-15 mg q 4 hours PRN 4. Dilaudid 0.5-1 mg q 4 hours PRN VTE prophylaxis:  Hold Lovenox , SCDs ID: Ceftriaxone post op per open fracture protocol completed Foley/Lines:  No foley, KVO IVFs Impediments to Fracture Healing: Open fx. Vit D level 20, started on supplementation Dispo: PT/OT evaluation ongoing, no immediate therapy follow up needed. Recheck H&H this afternoon. Possible d/c home if Hgb stable.  Will transition patient to Prevena wound VAC at discharge. Patient will need f/u with PCP at discharge to address BP. Have place referral for [facility] and [facility]  D/C recommendations: -Oxycodone and Robaxin for pain control -Aspirin 325 mg daily x 30 days for DVT prophylaxis -5000 units Vit D supplementation daily x 90 days  Follow - up plan:  1 week for removal of wound VAC and repeat x-rays   Contact information:  Truitt Merle MD, Thyra Breed PA-C. After hours and holidays please check [email or web address] for group call information for [facility]   Thompson Caul, PA-C [phone] (office) [email or web address]

## 2023-06-25 NOTE — Progress Notes (Addendum)
Order to discharge patient home. Discharge instructions/AVS given to and reviewed with patient. Education provided as needed. Patient verbalized understanding. PIV removed by this RN. Wound vac canister changed to Prevena. DME crutches delivered to patient prior to discharge. Personal belongings with patient. Maryruth Eve RN to assist patient in discharge. She will contact security for patients money and pick up TOC medications. Son on route with clothes.

## 2023-06-25 NOTE — Progress Notes (Signed)
Patient $246 retrieved from security and returned to patient. Patient also received TOC medications.

## 2023-06-25 NOTE — TOC Transition Note (Signed)
Transition of Care (TOC) - Discharge Note Donn Pierini RN,BSN Transitions of Care Unit 4NP (Non Trauma)- RN Case Manager See Treatment Team for direct Phone #   Patient Details  Name: Alexander Stone MRN: [id number] Date of Birth: January 28, 1981  Transition of Care [facility] CM/SW Contact:  Darrold Span, RN Phone Number: 06/25/2023, 1:41 PM   Clinical Narrative:    Pt stable for transition home today, noted order placed for crutches- msg sent to bedside RN to call ortho tech for crutches to be delivered to room prior to discharge.   Pt has wife and son, one of which will transport home, No HH needs noted.   PCP f/u appointment made for 2/28.    Final next level of care: Home/Self Care Barriers to Discharge: No Barriers Identified   Patient Goals and CMS Choice Patient states their goals for this hospitalization and ongoing recovery are:: return home   Choice offered to / list presented to : NA      Discharge Placement                 Home      Discharge Plan and Services Additional resources added to the After Visit Summary for     Discharge Planning Services: CM Consult, Follow-up appt scheduled            DME Arranged: Crutches DME Agency: [facility] Date DME Agency Contacted: 06/19/23   Representative spoke with at DME Agency: Bedside RN to call Ortho Tech for Crutches HH Arranged: NA HH Agency: NA        Social Drivers of Health (SDOH) Interventions SDOH Screenings   Food Insecurity: Patient Declined (06/23/2023)  Housing: Patient Declined (06/23/2023)  Transportation Needs: Patient Declined (06/23/2023)  Utilities: Patient Declined (06/23/2023)  Tobacco Use: High Risk (06/23/2023)     Readmission Risk Interventions    06/25/2023    1:40 PM  Readmission Risk Prevention Plan  Medication Screening Complete  Transportation Screening Complete
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
## 2023-07-01 DIAGNOSIS — S82101F Unspecified fracture of upper end of right tibia, subsequent encounter for open fracture type IIIA, IIIB, or IIIC with routine healing: Secondary | ICD-10-CM | POA: Diagnosis not present

## 2023-07-08 DIAGNOSIS — S82101F Unspecified fracture of upper end of right tibia, subsequent encounter for open fracture type IIIA, IIIB, or IIIC with routine healing: Secondary | ICD-10-CM | POA: Diagnosis not present

## 2023-07-22 DIAGNOSIS — S82101F Unspecified fracture of upper end of right tibia, subsequent encounter for open fracture type IIIA, IIIB, or IIIC with routine healing: Secondary | ICD-10-CM | POA: Diagnosis not present

## 2023-07-25 ENCOUNTER — Encounter: Payer: Self-pay | Admitting: Nurse Practitioner

## 2023-07-25 ENCOUNTER — Ambulatory Visit (INDEPENDENT_AMBULATORY_CARE_PROVIDER_SITE_OTHER): Payer: Medicaid Other | Admitting: Nurse Practitioner

## 2023-07-25 VITALS — BP 120/100 | HR 89 | Temp 97.5°F | Ht 71.0 in | Wt 229.0 lb

## 2023-07-25 DIAGNOSIS — F32A Depression, unspecified: Secondary | ICD-10-CM

## 2023-07-25 DIAGNOSIS — D509 Iron deficiency anemia, unspecified: Secondary | ICD-10-CM

## 2023-07-25 DIAGNOSIS — S82201C Unspecified fracture of shaft of right tibia, initial encounter for open fracture type IIIA, IIIB, or IIIC: Secondary | ICD-10-CM

## 2023-07-25 DIAGNOSIS — E559 Vitamin D deficiency, unspecified: Secondary | ICD-10-CM

## 2023-07-25 DIAGNOSIS — F17200 Nicotine dependence, unspecified, uncomplicated: Secondary | ICD-10-CM | POA: Insufficient documentation

## 2023-07-25 DIAGNOSIS — Z720 Tobacco use: Secondary | ICD-10-CM | POA: Diagnosis not present

## 2023-07-25 DIAGNOSIS — B009 Herpesviral infection, unspecified: Secondary | ICD-10-CM | POA: Diagnosis not present

## 2023-07-25 DIAGNOSIS — S82401C Unspecified fracture of shaft of right fibula, initial encounter for open fracture type IIIA, IIIB, or IIIC: Secondary | ICD-10-CM | POA: Diagnosis not present

## 2023-07-25 DIAGNOSIS — F419 Anxiety disorder, unspecified: Secondary | ICD-10-CM

## 2023-07-25 DIAGNOSIS — I10 Essential (primary) hypertension: Secondary | ICD-10-CM | POA: Insufficient documentation

## 2023-07-25 HISTORY — DX: Herpesviral infection, unspecified: B00.9

## 2023-07-25 HISTORY — DX: Nicotine dependence, unspecified, uncomplicated: F17.200

## 2023-07-25 HISTORY — DX: Vitamin D deficiency, unspecified: E55.9

## 2023-07-25 HISTORY — DX: Anxiety disorder, unspecified: F41.9

## 2023-07-25 NOTE — Assessment & Plan Note (Signed)
 On ferrous sulfate 325 mg twice daily Checking CBC, iron panel Lab Results  Component Value Date   WBC 8.3 06/25/2023   HGB 8.0 (L) 06/25/2023   HCT 25.5 (L) 06/25/2023   MCV 89.2 06/25/2023   PLT 204 06/25/2023

## 2023-07-25 NOTE — Assessment & Plan Note (Signed)
 Continue Aclovate 400 mg twice daily

## 2023-07-25 NOTE — Assessment & Plan Note (Addendum)
 Status post surgery Ambulating using crutches Patient encouraged to maintain close follow-up with orthopedics Encouraged to complete full course of doxycycline 100 mg twice daily ordered Signs of infection reviewed with the patient, encouraged to contact orthopedics if he notices any signs of infection

## 2023-07-25 NOTE — Assessment & Plan Note (Addendum)
 BP Readings from Last 3 Encounters:  07/25/23 (!) 120/100  06/25/23 (!) 141/84  10/14/15 138/92  Patient refused treatment Discussed DASH diet and dietary sodium restrictions Continue to increase dietary efforts and exercise.

## 2023-07-25 NOTE — Assessment & Plan Note (Signed)
    07/25/2023   10:19 AM  Depression screen PHQ 2/9  Decreased Interest 3  Down, Depressed, Hopeless 3  PHQ - 2 Score 6  Altered sleeping 0  Tired, decreased energy 3  Change in appetite 0  Feeling bad or failure about yourself  1  Trouble concentrating 0  Moving slowly or fidgety/restless 0  Suicidal thoughts 0  PHQ-9 Score 10  Difficult doing work/chores Not difficult at all          07/25/2023   10:22 AM  GAD 7 : Generalized Anxiety Score  Nervous, Anxious, on Edge 0  Control/stop worrying 3  Worry too much - different things 3  Trouble relaxing 3  Restless 3  Easily annoyed or irritable 1  Afraid - awful might happen 0  Total GAD 7 Score 13  Anxiety Difficulty Not difficult at all  Patient declines treatment He denies SI, HI

## 2023-07-25 NOTE — Assessment & Plan Note (Signed)
 Last vitamin D Lab Results  Component Value Date   VD25OH 20.76 (L) 06/23/2023  On vitamin D 5000 units daily Continue current medication Checking vitamin D levels

## 2023-07-25 NOTE — Patient Instructions (Signed)
 Please consider getting Influenza , pneumococcal and Tdap vaccine at local pharmacy.    It is important that you exercise regularly at least 30 minutes 5 times a week as tolerated  Think about what you will eat, plan ahead. Choose " clean, green, fresh or frozen" over canned, processed or packaged foods which are more sugary, salty and fatty. 70 to 75% of food eaten should be vegetables and fruit. Three meals at set times with snacks allowed between meals, but they must be fruit or vegetables. Aim to eat over a 12 hour period , example 7 am to 7 pm, and STOP after  your last meal of the day. Drink water,generally about 64 ounces per day, no other drink is as healthy. Fruit juice is best enjoyed in a healthy way, by EATING the fruit.  Thanks for choosing [facility] we consider it a privelige to serve you.
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
## 2023-07-26 LAB — IRON,TIBC AND FERRITIN PANEL
Ferritin: 168 ng/mL (ref 30–400)
Iron Saturation: 28 % (ref 15–55)
Iron: 82 ug/dL (ref 38–169)
Total Iron Binding Capacity: 292 ug/dL (ref 250–450)
UIBC: 210 ug/dL (ref 111–343)

## 2023-07-26 LAB — CBC
Hematocrit: 42.6 % (ref 37.5–51.0)
Hemoglobin: 13.2 g/dL (ref 13.0–17.7)
MCH: 28.9 pg (ref 26.6–33.0)
MCHC: 31 g/dL — ABNORMAL LOW (ref 31.5–35.7)
MCV: 93 fL (ref 79–97)
Platelets: 298 10*3/uL (ref 150–450)
RBC: 4.57 x10E6/uL (ref 4.14–5.80)
RDW: 16.8 % — ABNORMAL HIGH (ref 11.6–15.4)
WBC: 7.6 10*3/uL (ref 3.4–10.8)

## 2023-07-26 LAB — BASIC METABOLIC PANEL
BUN/Creatinine Ratio: 13 (ref 9–20)
BUN: 21 mg/dL (ref 6–24)
CO2: 25 mmol/L (ref 20–29)
Calcium: 10.6 mg/dL — ABNORMAL HIGH (ref 8.7–10.2)
Chloride: 103 mmol/L (ref 96–106)
Creatinine, Ser: 1.56 mg/dL — ABNORMAL HIGH (ref 0.76–1.27)
Glucose: 89 mg/dL (ref 70–99)
Potassium: 6.1 mmol/L — ABNORMAL HIGH (ref 3.5–5.2)
Sodium: 141 mmol/L (ref 134–144)
eGFR: 57 mL/min/{1.73_m2} — ABNORMAL LOW (ref >59)

## 2023-07-26 LAB — VITAMIN D 25 HYDROXY (VIT D DEFICIENCY, FRACTURES): Vit D, 25-Hydroxy: 39.4 ng/mL (ref 30.0–100.0)

## 2023-07-28 ENCOUNTER — Other Ambulatory Visit: Payer: Self-pay | Admitting: Nurse Practitioner

## 2023-07-28 DIAGNOSIS — E875 Hyperkalemia: Secondary | ICD-10-CM

## 2023-07-31 ENCOUNTER — Other Ambulatory Visit: Payer: Self-pay

## 2023-08-05 DIAGNOSIS — S82101F Unspecified fracture of upper end of right tibia, subsequent encounter for open fracture type IIIA, IIIB, or IIIC with routine healing: Secondary | ICD-10-CM | POA: Diagnosis not present

## 2023-08-26 DIAGNOSIS — S82101F Unspecified fracture of upper end of right tibia, subsequent encounter for open fracture type IIIA, IIIB, or IIIC with routine healing: Secondary | ICD-10-CM | POA: Diagnosis not present
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
## 2023-08-27 ENCOUNTER — Ambulatory Visit: Payer: Self-pay | Admitting: Nurse Practitioner

## 2023-08-27 ENCOUNTER — Encounter: Payer: Self-pay | Admitting: Nurse Practitioner

## 2023-08-27 VITALS — BP 132/92 | HR 83 | Temp 97.0°F | Wt 233.0 lb

## 2023-08-27 DIAGNOSIS — R21 Rash and other nonspecific skin eruption: Secondary | ICD-10-CM | POA: Insufficient documentation

## 2023-08-27 DIAGNOSIS — Z720 Tobacco use: Secondary | ICD-10-CM | POA: Diagnosis not present

## 2023-08-27 DIAGNOSIS — S82401C Unspecified fracture of shaft of right fibula, initial encounter for open fracture type IIIA, IIIB, or IIIC: Secondary | ICD-10-CM

## 2023-08-27 DIAGNOSIS — S82201C Unspecified fracture of shaft of right tibia, initial encounter for open fracture type IIIA, IIIB, or IIIC: Secondary | ICD-10-CM

## 2023-08-27 DIAGNOSIS — I10 Essential (primary) hypertension: Secondary | ICD-10-CM | POA: Diagnosis not present

## 2023-08-27 DIAGNOSIS — B009 Herpesviral infection, unspecified: Secondary | ICD-10-CM

## 2023-08-27 DIAGNOSIS — Z113 Encounter for screening for infections with a predominantly sexual mode of transmission: Secondary | ICD-10-CM | POA: Diagnosis not present

## 2023-08-27 MED ORDER — ACYCLOVIR 400 MG PO TABS: 400.0000 mg | ORAL_TABLET | Freq: Two times a day (BID) | ORAL | 6 refills | Status: DC

## 2023-08-27 MED ORDER — LOSARTAN POTASSIUM 25 MG PO TABS: 25.0000 mg | ORAL_TABLET | Freq: Every day | ORAL | 1 refills | Status: DC

## 2023-08-27 MED ORDER — AMLODIPINE BESYLATE 2.5 MG PO TABS: 2.5000 mg | ORAL_TABLET | Freq: Every day | ORAL | 1 refills | Status: DC

## 2023-08-27 NOTE — Assessment & Plan Note (Addendum)
 Blood pressure remains elevated Start amlodipine 2.5 mg daily Encouraged to monitor blood pressure at home Blood pressure goal is less than 130/80 Follow-up in 4 weeks DASH diet and commitment to daily physical activity for a minimum of 30 minutes discussed and encouraged, as a part of hypertension management. The importance of attaining a healthy weight is also discussed.     08/27/2023    8:16 AM 08/27/2023    8:06 AM 07/25/2023   10:26 AM 07/25/2023   10:15 AM 06/25/2023    3:55 AM 06/24/2023    7:26 PM 06/24/2023    4:03 PM  BP/Weight  Systolic BP 132 139 120 140 141 141 159  Diastolic BP 92 110 100 89 84 86 94  Wt. (Lbs)  233  229     BMI  32.5 kg/m2  31.94 kg/m2

## 2023-08-27 NOTE — Assessment & Plan Note (Signed)
-   [facility] - HepB+HepC+HIV Panel - Chlamydia/Gonococcus/Trichomonas, NAA - [facility]

## 2023-08-27 NOTE — Assessment & Plan Note (Signed)
 Smokes about 1 pack/day  Asked about quitting: confirms that he/she currently smokes cigarettes Advise to quit smoking: Educated about QUITTING to reduce the risk of cancer, cardio and cerebrovascular disease. Assess willingness: Unwilling to quit at this time, not working on cutting back. Assist with counseling and pharmacotherapy: Counseled for 5 minutes and literature provided. Arrange for follow up: follow up in 1 month and continue to offer help.

## 2023-08-27 NOTE — Assessment & Plan Note (Signed)
 Lab Results  Component Value Date   NA 141 07/25/2023   K 6.1 (H) 07/25/2023   CO2 25 07/25/2023   GLUCOSE 89 07/25/2023   BUN 21 07/25/2023   CREATININE 1.56 (H) 07/25/2023   CALCIUM 10.6 (H) 07/25/2023   EGFR 57 (L) 07/25/2023   GFRNONAA 47 (L) 06/25/2023  Rechecking labs today

## 2023-08-27 NOTE — Patient Instructions (Addendum)
 1. Tobacco use (Primary)   2. HSV infection  - acyclovir (ZOVIRAX) 400 MG tablet; Take 1 tablet (400 mg total) by mouth 2 (two) times daily.  Dispense: 60 tablet; Refill: 6  3. Primary hypertension  - amLODipine (NORVASC) 2.5 MG tablet; Take 1 tablet (2.5 mg total) by mouth daily.  Dispense: 90 tablet; Refill: 1     Around 3 times per week, check your blood pressure 2 times per day. once in the morning and once in the evening. The readings should be at least one minute apart. Write down these values and bring them to your next nurse visit/appointment.  When you check your BP, make sure you have been doing something calm/relaxing 5 minutes prior to checking. Both feet should be flat on the floor and you should be sitting. Use your left arm and make sure it is in a relaxed position (on a table), and that the cuff is at the approximate level/height of your heart.    It is important that you exercise regularly at least 30 minutes 5 times a week as tolerated  Think about what you will eat, plan ahead. Choose " clean, green, fresh or frozen" over canned, processed or packaged foods which are more sugary, salty and fatty. 70 to 75% of food eaten should be vegetables and fruit. Three meals at set times with snacks allowed between meals, but they must be fruit or vegetables. Aim to eat over a 12 hour period , example 7 am to 7 pm, and STOP after  your last meal of the day. Drink water,generally about 64 ounces per day, no other drink is as healthy. Fruit juice is best enjoyed in a healthy way, by EATING the fruit.  Thanks for choosing [facility] we consider it a privelige to serve you.

## 2023-08-27 NOTE — Assessment & Plan Note (Signed)
 Acyclovir 400 mg twice daily refilled

## 2023-08-27 NOTE — Assessment & Plan Note (Signed)
 Start physical therapy this week Uses crutches for ambulation and walking better
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
## 2023-08-28 LAB — BASIC METABOLIC PANEL WITH GFR
BUN/Creatinine Ratio: 7 — ABNORMAL LOW (ref 9–20)
BUN: 10 mg/dL (ref 6–24)
CO2: 23 mmol/L (ref 20–29)
Calcium: 10.1 mg/dL (ref 8.7–10.2)
Chloride: 103 mmol/L (ref 96–106)
Creatinine, Ser: 1.42 mg/dL — ABNORMAL HIGH (ref 0.76–1.27)
Glucose: 96 mg/dL (ref 70–99)
Potassium: 4.6 mmol/L (ref 3.5–5.2)
Sodium: 141 mmol/L (ref 134–144)
eGFR: 64 mL/min/{1.73_m2} (ref >59)

## 2023-08-28 LAB — PTH, INTACT AND CALCIUM
Calcium: 10.4 mg/dL — ABNORMAL HIGH (ref 8.7–10.2)
PTH: 49 pg/mL (ref 15–65)

## 2023-08-28 LAB — RPR: RPR Ser Ql: NONREACTIVE

## 2023-08-28 LAB — HEPB+HEPC+HIV PANEL
HIV Screen 4th Generation wRfx: NONREACTIVE
Hep B C IgM: NEGATIVE
Hep B Core Total Ab: NEGATIVE
Hep B E Ab: NONREACTIVE
Hep B E Ag: NEGATIVE
Hep B Surface Ab, Qual: NONREACTIVE
Hep C Virus Ab: NONREACTIVE
Hepatitis B Surface Ag: NEGATIVE

## 2023-08-28 LAB — HSV 1 AND 2 AB, IGG
HSV 1 Glycoprotein G Ab, IgG: NONREACTIVE
HSV 2 IgG, Type Spec: REACTIVE — AB

## 2023-08-29 ENCOUNTER — Ambulatory Visit: Attending: Student

## 2023-08-29 ENCOUNTER — Other Ambulatory Visit: Payer: Self-pay

## 2023-08-29 DIAGNOSIS — M79661 Pain in right lower leg: Secondary | ICD-10-CM | POA: Diagnosis not present

## 2023-08-29 DIAGNOSIS — R262 Difficulty in walking, not elsewhere classified: Secondary | ICD-10-CM | POA: Insufficient documentation

## 2023-08-29 DIAGNOSIS — M6281 Muscle weakness (generalized): Secondary | ICD-10-CM | POA: Diagnosis not present

## 2023-08-29 LAB — CHLAMYDIA/GONOCOCCUS/TRICHOMONAS, NAA
Chlamydia by NAA: NEGATIVE
Gonococcus by NAA: NEGATIVE
Trich vag by NAA: NEGATIVE
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
## 2023-09-05 ENCOUNTER — Ambulatory Visit

## 2023-09-05 DIAGNOSIS — R262 Difficulty in walking, not elsewhere classified: Secondary | ICD-10-CM

## 2023-09-05 DIAGNOSIS — M6281 Muscle weakness (generalized): Secondary | ICD-10-CM | POA: Diagnosis not present

## 2023-09-05 DIAGNOSIS — M79661 Pain in right lower leg: Secondary | ICD-10-CM
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
## 2023-09-09 ENCOUNTER — Other Ambulatory Visit (HOSPITAL_BASED_OUTPATIENT_CLINIC_OR_DEPARTMENT_OTHER): Payer: Self-pay

## 2023-09-09 ENCOUNTER — Ambulatory Visit

## 2023-09-09 DIAGNOSIS — M79661 Pain in right lower leg: Secondary | ICD-10-CM | POA: Diagnosis not present

## 2023-09-09 DIAGNOSIS — R262 Difficulty in walking, not elsewhere classified: Secondary | ICD-10-CM

## 2023-09-09 DIAGNOSIS — M6281 Muscle weakness (generalized): Secondary | ICD-10-CM
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
## 2023-09-11 ENCOUNTER — Ambulatory Visit

## 2023-09-11 DIAGNOSIS — M6281 Muscle weakness (generalized): Secondary | ICD-10-CM | POA: Diagnosis not present

## 2023-09-11 DIAGNOSIS — M79661 Pain in right lower leg: Secondary | ICD-10-CM | POA: Diagnosis not present

## 2023-09-11 DIAGNOSIS — R262 Difficulty in walking, not elsewhere classified: Secondary | ICD-10-CM

## 2023-09-16 ENCOUNTER — Ambulatory Visit: Admitting: Physical Therapy

## 2023-09-16 ENCOUNTER — Encounter: Payer: Self-pay | Admitting: Physical Therapy

## 2023-09-16 DIAGNOSIS — M6281 Muscle weakness (generalized): Secondary | ICD-10-CM

## 2023-09-16 DIAGNOSIS — M79661 Pain in right lower leg: Secondary | ICD-10-CM

## 2023-09-16 DIAGNOSIS — R262 Difficulty in walking, not elsewhere classified: Secondary | ICD-10-CM
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
## 2023-09-19 ENCOUNTER — Ambulatory Visit

## 2023-09-19 DIAGNOSIS — M6281 Muscle weakness (generalized): Secondary | ICD-10-CM

## 2023-09-19 DIAGNOSIS — M79661 Pain in right lower leg: Secondary | ICD-10-CM | POA: Diagnosis not present

## 2023-09-19 DIAGNOSIS — R262 Difficulty in walking, not elsewhere classified: Secondary | ICD-10-CM
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
## 2023-09-23 ENCOUNTER — Ambulatory Visit

## 2023-09-23 DIAGNOSIS — R262 Difficulty in walking, not elsewhere classified: Secondary | ICD-10-CM | POA: Diagnosis not present

## 2023-09-23 DIAGNOSIS — M6281 Muscle weakness (generalized): Secondary | ICD-10-CM | POA: Diagnosis not present

## 2023-09-23 DIAGNOSIS — M79661 Pain in right lower leg: Secondary | ICD-10-CM
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
## 2023-09-25 ENCOUNTER — Ambulatory Visit

## 2023-10-01 ENCOUNTER — Ambulatory Visit: Payer: Self-pay | Admitting: Nurse Practitioner
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
## 2023-10-03 ENCOUNTER — Ambulatory Visit: Attending: Student

## 2023-10-03 DIAGNOSIS — M6281 Muscle weakness (generalized): Secondary | ICD-10-CM | POA: Insufficient documentation

## 2023-10-03 DIAGNOSIS — M79661 Pain in right lower leg: Secondary | ICD-10-CM | POA: Diagnosis not present

## 2023-10-03 DIAGNOSIS — R262 Difficulty in walking, not elsewhere classified: Secondary | ICD-10-CM | POA: Diagnosis not present

## 2023-10-07 ENCOUNTER — Ambulatory Visit: Payer: Self-pay | Admitting: Nurse Practitioner

## 2023-10-07 DIAGNOSIS — S82101F Unspecified fracture of upper end of right tibia, subsequent encounter for open fracture type IIIA, IIIB, or IIIC with routine healing: Secondary | ICD-10-CM | POA: Diagnosis not present
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
## 2023-10-08 ENCOUNTER — Ambulatory Visit

## 2023-10-08 DIAGNOSIS — R262 Difficulty in walking, not elsewhere classified: Secondary | ICD-10-CM | POA: Diagnosis not present

## 2023-10-08 DIAGNOSIS — M79661 Pain in right lower leg: Secondary | ICD-10-CM | POA: Diagnosis not present

## 2023-10-08 DIAGNOSIS — M6281 Muscle weakness (generalized): Secondary | ICD-10-CM | POA: Diagnosis not present

## 2023-10-14 ENCOUNTER — Ambulatory Visit: Admitting: Physical Therapy

## 2023-10-21 ENCOUNTER — Telehealth: Payer: Self-pay

## 2023-10-21 ENCOUNTER — Ambulatory Visit

## 2023-10-21 NOTE — Telephone Encounter (Signed)
 Spoke to pt via phone re: no show appt. Pt states he did not receive a text reminder. Pt was advised of the attendance policy and his next appt.
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
## 2023-10-22 ENCOUNTER — Ambulatory Visit: Payer: Self-pay | Admitting: Nurse Practitioner

## 2023-10-23 ENCOUNTER — Ambulatory Visit

## 2023-10-23 DIAGNOSIS — M6281 Muscle weakness (generalized): Secondary | ICD-10-CM

## 2023-10-23 DIAGNOSIS — R262 Difficulty in walking, not elsewhere classified: Secondary | ICD-10-CM | POA: Diagnosis not present

## 2023-10-23 DIAGNOSIS — M79661 Pain in right lower leg: Secondary | ICD-10-CM
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
## 2023-10-29 ENCOUNTER — Ambulatory Visit: Attending: Student

## 2023-10-29 DIAGNOSIS — G8929 Other chronic pain: Secondary | ICD-10-CM | POA: Diagnosis not present

## 2023-10-29 DIAGNOSIS — R600 Localized edema: Secondary | ICD-10-CM | POA: Insufficient documentation

## 2023-10-29 DIAGNOSIS — M6281 Muscle weakness (generalized): Secondary | ICD-10-CM | POA: Insufficient documentation

## 2023-10-29 DIAGNOSIS — M79661 Pain in right lower leg: Secondary | ICD-10-CM | POA: Diagnosis not present

## 2023-10-29 DIAGNOSIS — M25561 Pain in right knee: Secondary | ICD-10-CM | POA: Insufficient documentation

## 2023-10-29 DIAGNOSIS — R262 Difficulty in walking, not elsewhere classified: Secondary | ICD-10-CM | POA: Diagnosis not present
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
## 2023-11-05 ENCOUNTER — Ambulatory Visit

## 2023-11-05 DIAGNOSIS — R600 Localized edema: Secondary | ICD-10-CM | POA: Diagnosis not present

## 2023-11-05 DIAGNOSIS — G8929 Other chronic pain: Secondary | ICD-10-CM

## 2023-11-05 DIAGNOSIS — R262 Difficulty in walking, not elsewhere classified: Secondary | ICD-10-CM

## 2023-11-05 DIAGNOSIS — M79661 Pain in right lower leg: Secondary | ICD-10-CM | POA: Diagnosis not present

## 2023-11-05 DIAGNOSIS — M6281 Muscle weakness (generalized): Secondary | ICD-10-CM

## 2023-11-05 DIAGNOSIS — M25561 Pain in right knee: Secondary | ICD-10-CM | POA: Diagnosis not present

## 2023-11-14 ENCOUNTER — Telehealth: Payer: Self-pay | Admitting: Physical Therapy

## 2023-11-14 ENCOUNTER — Ambulatory Visit: Admitting: Physical Therapy

## 2023-11-14 NOTE — Telephone Encounter (Signed)
 Left voicemail regarding no show and next appt date

## 2023-11-18 DIAGNOSIS — S82101F Unspecified fracture of upper end of right tibia, subsequent encounter for open fracture type IIIA, IIIB, or IIIC with routine healing: Secondary | ICD-10-CM | POA: Diagnosis not present
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
## 2023-11-19 ENCOUNTER — Ambulatory Visit

## 2023-11-19 DIAGNOSIS — M6281 Muscle weakness (generalized): Secondary | ICD-10-CM

## 2023-11-19 DIAGNOSIS — R262 Difficulty in walking, not elsewhere classified: Secondary | ICD-10-CM

## 2023-11-19 DIAGNOSIS — R600 Localized edema: Secondary | ICD-10-CM | POA: Diagnosis not present

## 2023-11-19 DIAGNOSIS — M25561 Pain in right knee: Secondary | ICD-10-CM | POA: Diagnosis not present

## 2023-11-19 DIAGNOSIS — G8929 Other chronic pain: Secondary | ICD-10-CM

## 2023-11-19 DIAGNOSIS — M79661 Pain in right lower leg: Secondary | ICD-10-CM | POA: Diagnosis not present

## 2023-11-25 ENCOUNTER — Encounter

## 2023-12-02 ENCOUNTER — Encounter

## 2024-01-20 DIAGNOSIS — S82101F Unspecified fracture of upper end of right tibia, subsequent encounter for open fracture type IIIA, IIIB, or IIIC with routine healing: Secondary | ICD-10-CM | POA: Diagnosis not present

## 2024-02-06 ENCOUNTER — Other Ambulatory Visit: Payer: Self-pay | Admitting: Nurse Practitioner

## 2024-02-06 DIAGNOSIS — B009 Herpesviral infection, unspecified: Secondary | ICD-10-CM

## 2024-02-06 MED ORDER — ACYCLOVIR 400 MG PO TABS
400.0000 mg | ORAL_TABLET | Freq: Two times a day (BID) | ORAL | 6 refills | Status: DC
Start: 2024-02-06 — End: 2024-04-06

## 2024-02-06 NOTE — Telephone Encounter (Signed)
 Copied from CRM #[id number]. Topic: Clinical - Medication Refill >> Feb 06, 2024 11:54 AM Travis F wrote: Medication: acyclovir  (ZOVIRAX ) 400 MG tablet [[id number]]  Has the patient contacted their pharmacy? Yes  (Agent: If yes, when and what did the pharmacy advise?) Contact office   This is the patient's preferred pharmacy:  [id number] [place], [place] - [address] AT [facility] [address] [facility] [address] [place] [address] Phone: [phone] Fax: [phone]   Is this the correct pharmacy for this prescription? Yes If no, delete pharmacy and type the correct one.   Has the prescription been filled recently? Yes  Is the patient out of the medication? No  Has the patient been seen for an appointment in the last year OR does the patient have an upcoming appointment? No  Can we respond through MyChart? No  Agent: Please be advised that Rx refills may take up to 3 business days. We ask that you follow-up with your pharmacy.

## 2024-02-06 NOTE — Telephone Encounter (Signed)
 Please advise [facility]

## 2024-03-12 ENCOUNTER — Ambulatory Visit: Admitting: Nurse Practitioner

## 2024-03-12 ENCOUNTER — Ambulatory Visit: Payer: Self-pay

## 2024-03-12 ENCOUNTER — Encounter: Payer: Self-pay | Admitting: Nurse Practitioner

## 2024-03-12 VITALS — BP 151/99 | HR 78 | Wt 234.0 lb

## 2024-03-12 DIAGNOSIS — I10 Essential (primary) hypertension: Secondary | ICD-10-CM

## 2024-03-12 DIAGNOSIS — Z720 Tobacco use: Secondary | ICD-10-CM

## 2024-03-12 DIAGNOSIS — Z113 Encounter for screening for infections with a predominantly sexual mode of transmission: Secondary | ICD-10-CM | POA: Diagnosis not present

## 2024-03-12 DIAGNOSIS — B009 Herpesviral infection, unspecified: Secondary | ICD-10-CM

## 2024-03-12 DIAGNOSIS — B356 Tinea cruris: Secondary | ICD-10-CM | POA: Diagnosis not present

## 2024-03-12 DIAGNOSIS — R10A2 Flank pain, left side: Secondary | ICD-10-CM | POA: Diagnosis not present

## 2024-03-12 LAB — POCT URINALYSIS DIP (CLINITEK)
Bilirubin, UA: NEGATIVE
Blood, UA: NEGATIVE
Glucose, UA: NEGATIVE mg/dL
Ketones, POC UA: NEGATIVE mg/dL
Leukocytes, UA: NEGATIVE
Nitrite, UA: NEGATIVE
POC PROTEIN,UA: NEGATIVE
Spec Grav, UA: 1.02 (ref 1.010–1.025)
Urobilinogen, UA: 0.2 U/dL
pH, UA: 6 (ref 5.0–8.0)

## 2024-03-12 MED ORDER — AMLODIPINE BESYLATE 5 MG PO TABS: 5.0000 mg | ORAL_TABLET | Freq: Every day | ORAL | 1 refills | Status: DC

## 2024-03-12 MED ORDER — TERBINAFINE HCL 250 MG PO TABS: 250.0000 mg | ORAL_TABLET | Freq: Every day | ORAL | 0 refills | Status: DC

## 2024-03-12 NOTE — Assessment & Plan Note (Signed)
 Start amlodipine  5 mg daily Follow-up in 4 weeks DASH diet and commitment to daily physical activity for a minimum of 30 minutes discussed and encouraged, as a part of hypertension management.      03/12/2024    1:57 PM 03/12/2024    1:48 PM 08/27/2023    8:16 AM 08/27/2023    8:06 AM 07/25/2023   10:26 AM 07/25/2023   10:15 AM 06/25/2023    3:55 AM  BP/Weight  Systolic BP 151 154 132 139 120 140 141  Diastolic BP 99 94 92 110 100 89 84  Wt. (Lbs)  234  233  229   BMI  32.64 kg/m2  32.5 kg/m2  31.94 kg/m2

## 2024-03-12 NOTE — Assessment & Plan Note (Addendum)
 No rashes noted on examination of the genital area, will treat for tinea cruris Terbinafine 250 mg once daily for 2 weeks ordered

## 2024-03-12 NOTE — Assessment & Plan Note (Signed)
 UA negative for UTI, pain most likely muscular skeletal Advised Tylenol  650 mg every 6 hours as needed for pain

## 2024-03-12 NOTE — Assessment & Plan Note (Signed)
  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 - Perform STD testing. - Continue acyclovir  400 mg orally twice daily. - Discussed medication adherence

## 2024-03-12 NOTE — Patient Instructions (Addendum)
   1. Primary hypertension  - amLODipine  (NORVASC ) 5 MG tablet; Take 1 tablet (5 mg total) by mouth daily.  Dispense: 60 tablet; Refill: 1 - CMP14+EGFR  2. Screen for STD (sexually transmitted disease) (Primary)  - Chlamydia/Gonococcus/Trichomonas, NAA - [facility] - HepB+HepC+HIV Panel  3. Tinea cruris  - terbinafine (LAMISIL) 250 MG tablet; Take 1 tablet (250 mg total) by mouth daily.  Dispense: 14 tablet; Refill: 0    Around 3 times per week, check your blood pressure 2 times per day. once in the morning and once in the evening. The readings should be at least one minute apart. Write down these values and bring them to your next nurse visit/appointment.  When you check your BP, make sure you have been doing something calm/relaxing 5 minutes prior to checking. Both feet should be flat on the floor and you should be sitting. Use your left arm and make sure it is in a relaxed position (on a table), and that the cuff is at the approximate level/height of your heart.       It is important that you exercise regularly at least 30 minutes 5 times a week as tolerated  Think about what you will eat, plan ahead. Choose  clean, green, fresh or frozen over canned, processed or packaged foods which are more sugary, salty and fatty. 70 to 75% of food eaten should be vegetables and fruit. Three meals at set times with snacks allowed between meals, but they must be fruit or vegetables. Aim to eat over a 12 hour period , example 7 am to 7 pm, and STOP after  your last meal of the day. Drink water,generally about 64 ounces per day, no other drink is as healthy. Fruit juice is best enjoyed in a healthy way, by EATING the fruit.  Thanks for choosing [facility] we consider it a privelige to serve you.

## 2024-03-12 NOTE — Telephone Encounter (Signed)
 FYI Only or Action Required?: FYI only for provider.  Patient was last seen in primary care on 08/27/2023 by Paseda, Folashade R, FNP.  Called Nurse Triage reporting wound.  Symptoms began several weeks ago.  Interventions attempted: Rest, hydration, or home remedies.  Symptoms are: gradually worsening.  Triage Disposition: See Physician Within 24 Hours  Patient/caregiver understands and will follow disposition?: Yes  Copied from CRM #[id number]. Topic: Clinical - Red Word Triage >> Mar 12, 2024  9:38 AM Joesph NOVAK wrote: Red Word that prompted transfer to Nurse Triage: Patient is having a bad outbreak for two weeks. Taking medication, pain in his kidneys. Reason for Disposition  [1] Single large node AND [2] size > 1 inch (2.5 cm) AND [3] no fever  Answer Assessment - Initial Assessment Questions Pt states over the last couple of weeks, he's had an HIV outbreak. Has been double dosing his medications to contain it a little. States he has one open sore to base of penis. Also admits to swollen lymph nodes, and occasional night sweats. States his kidneys just started hurting. Denies any urinary sxs  1. LOCATION: Where is the swollen node located? Is the matching node on the other side of the body also swollen?      Left side 2. SIZE: How big is the node? (e.g., inches or centimeters; or compared to common objects such as pea, bean, marble, golf ball)      Swollen 3. ONSET: When did the swelling start?      Last week 4. NECK NODES: Is there a sore throat, runny nose or other symptoms of a cold?      denies 5. GROIN OR ARMPIT NODES: Is there a sore, scratch, cut or painful red area on that arm or leg?      Sore at base of penis 6. FEVER: Do you have a fever? If Yes, ask: What is it, how was it measured, and when did it start?      denies 7. CAUSE: What do you think is causing the swollen lymph nodes?     HIV outbreak 8. OTHER SYMPTOMS: Do you have any other symptoms?  (e.g., node is tender to touch, skin redness over node, weight changes)     Kidney pain  Protocols used: Lymph Nodes - Swollen-A-AH
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
## 2024-03-12 NOTE — Assessment & Plan Note (Signed)
 Smokes about 1.5 pack/day  Asked about quitting: confirms that he currently smokes cigarettes Advise to quit smoking: Educated about QUITTING to reduce the risk of cancer, cardio and cerebrovascular disease. Assess willingness: Unwilling to quit at this time, not working on cutting back. Assist with counseling and pharmacotherapy: Counseled for 5 minutes and literature provided. Arrange for follow up: follow up in 1 month and continue to offer help.

## 2024-03-13 LAB — CMP14+EGFR
ALT: 21 IU/L (ref 0–44)
AST: 18 IU/L (ref 0–40)
Albumin: 3.9 g/dL — ABNORMAL LOW (ref 4.1–5.1)
Alkaline Phosphatase: 105 IU/L (ref 47–123)
BUN/Creatinine Ratio: 9 (ref 9–20)
BUN: 12 mg/dL (ref 6–24)
Bilirubin Total: 0.4 mg/dL (ref 0.0–1.2)
CO2: 23 mmol/L (ref 20–29)
Calcium: 9.7 mg/dL (ref 8.7–10.2)
Chloride: 103 mmol/L (ref 96–106)
Creatinine, Ser: 1.33 mg/dL — ABNORMAL HIGH (ref 0.76–1.27)
Globulin, Total: 3.1 g/dL (ref 1.5–4.5)
Glucose: 72 mg/dL (ref 70–99)
Potassium: 4.2 mmol/L (ref 3.5–5.2)
Sodium: 140 mmol/L (ref 134–144)
Total Protein: 7 g/dL (ref 6.0–8.5)
eGFR: 68 mL/min/1.73 (ref >59)

## 2024-03-13 LAB — HEPB+HEPC+HIV PANEL
HIV Screen 4th Generation wRfx: NONREACTIVE
Hep B C IgM: NEGATIVE
Hep B Core Total Ab: NEGATIVE
Hep B E Ab: NONREACTIVE
Hep B E Ag: NEGATIVE
Hep B Surface Ab, Qual: NONREACTIVE
Hep C Virus Ab: NONREACTIVE
Hepatitis B Surface Ag: NEGATIVE

## 2024-03-13 LAB — RPR: RPR Ser Ql: NONREACTIVE

## 2024-03-14 LAB — CHLAMYDIA/GONOCOCCUS/TRICHOMONAS, NAA
Chlamydia by NAA: NEGATIVE
Gonococcus by NAA: NEGATIVE
Trich vag by NAA: NEGATIVE

## 2024-03-15 ENCOUNTER — Ambulatory Visit: Payer: Self-pay | Admitting: Nurse Practitioner

## 2024-04-04 ENCOUNTER — Other Ambulatory Visit: Payer: Self-pay | Admitting: Nurse Practitioner

## 2024-04-04 DIAGNOSIS — B009 Herpesviral infection, unspecified: Secondary | ICD-10-CM

## 2024-04-06 NOTE — Telephone Encounter (Signed)
 Please advise [facility]

## 2024-04-20 ENCOUNTER — Ambulatory Visit: Payer: Self-pay | Admitting: Nurse Practitioner

## 2024-04-20 ENCOUNTER — Encounter: Payer: Self-pay | Admitting: Nurse Practitioner

## 2024-04-20 VITALS — BP 136/83 | HR 80 | Wt 231.4 lb

## 2024-04-20 DIAGNOSIS — I10 Essential (primary) hypertension: Secondary | ICD-10-CM | POA: Diagnosis not present

## 2024-04-20 DIAGNOSIS — B356 Tinea cruris: Secondary | ICD-10-CM

## 2024-04-20 DIAGNOSIS — F17200 Nicotine dependence, unspecified, uncomplicated: Secondary | ICD-10-CM | POA: Diagnosis not present

## 2024-04-20 DIAGNOSIS — B009 Herpesviral infection, unspecified: Secondary | ICD-10-CM | POA: Diagnosis not present

## 2024-04-20 MED ORDER — ACYCLOVIR 400 MG PO TABS
400.0000 mg | ORAL_TABLET | Freq: Two times a day (BID) | ORAL | 2 refills | Status: AC
Start: 2024-04-20 — End: ?

## 2024-04-20 MED ORDER — AMLODIPINE BESYLATE 5 MG PO TABS: 5.0000 mg | ORAL_TABLET | Freq: Every day | ORAL | 1 refills | Status: AC
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
## 2024-04-20 NOTE — Assessment & Plan Note (Signed)
 Has completed full course of terbinafine  ordered, he is currently doing well no rashes - Advised keeping groin area clean and dry. - Recommended avoiding scented body washes and detergents. - Suggested over-the-counter antifungal cream if irritation  recurs. - Instructed to schedule appointment if ineffective.

## 2024-04-20 NOTE — Patient Instructions (Signed)
 If you experience yeast infection again you can try over-the-counter medications like clotrimazole cream  It is important that you exercise regularly at least 30 minutes 5 times a week as tolerated  Think about what you will eat, plan ahead. Choose  clean, green, fresh or frozen over canned, processed or packaged foods which are more sugary, salty and fatty. 70 to 75% of food eaten should be vegetables and fruit. Three meals at set times with snacks allowed between meals, but they must be fruit or vegetables. Aim to eat over a 12 hour period , example 7 am to 7 pm, and STOP after  your last meal of the day. Drink water,generally about 64 ounces per day, no other drink is as healthy. Fruit juice is best enjoyed in a healthy way, by EATING the fruit.  Thanks for choosing [facility] we consider it a privelige to serve you.

## 2024-04-20 NOTE — Assessment & Plan Note (Signed)
 BP Readings from Last 3 Encounters:  04/20/24 136/83  03/12/24 (!) 151/99  08/27/23 (!) 132/92   Blood pressure controlled at 136/83 mmHg with amlodipine . Non-adherence risks include kidney damage and stroke. - Continue amlodipine  5 mg daily. - Encouraged home blood pressure monitoring. - Advised on low-salt, low-fat diet and regular moderate exercise at least 30 minutes 5 days a week as tolerated - Encouraged smoking cessation. Blood pressure monitor ordered

## 2024-04-20 NOTE — Assessment & Plan Note (Signed)
  Managed with acyclovir  400 mg twice daily. Adequate supply maintained. - Continue acyclovir  400 mg twice daily. - Ensure adequate supply of acyclovir .

## 2024-04-20 NOTE — Assessment & Plan Note (Signed)
 Smokes one pack daily. Previous cessation attempts with nicotine  patch unsuccessful. Desires to quit for health improvement. - Encouraged smoking cessation. - Discussed potential use of Chantix and Wellbutrin but he declined  - Provided smoking cessation resources,

## 2024-04-26 DIAGNOSIS — S82101F Unspecified fracture of upper end of right tibia, subsequent encounter for open fracture type IIIA, IIIB, or IIIC with routine healing: Secondary | ICD-10-CM | POA: Diagnosis not present

## 2024-07-21 ENCOUNTER — Ambulatory Visit: Payer: Self-pay | Admitting: Nurse Practitioner
